# Patient Record
Sex: Male | Born: 1955 | Race: White | Hispanic: No | Smoking: Never smoker
Health system: Southern US, Community
[De-identification: ages and names within clinical notes are randomized; demographics above are authoritative.]

## PROBLEM LIST (undated history)

## (undated) DIAGNOSIS — Z87442 Personal history of urinary calculi: Secondary | ICD-10-CM

## (undated) DIAGNOSIS — N289 Disorder of kidney and ureter, unspecified: Secondary | ICD-10-CM

## (undated) DIAGNOSIS — I519 Heart disease, unspecified: Secondary | ICD-10-CM

## (undated) DIAGNOSIS — I517 Cardiomegaly: Secondary | ICD-10-CM

## (undated) DIAGNOSIS — I639 Cerebral infarction, unspecified: Secondary | ICD-10-CM

## (undated) DIAGNOSIS — T4145XA Adverse effect of unspecified anesthetic, initial encounter: Secondary | ICD-10-CM

## (undated) DIAGNOSIS — I10 Essential (primary) hypertension: Secondary | ICD-10-CM

## (undated) DIAGNOSIS — T8859XA Other complications of anesthesia, initial encounter: Secondary | ICD-10-CM

## (undated) HISTORY — PX: WISDOM TOOTH EXTRACTION: SHX21

## (undated) HISTORY — PX: FACIAL COSMETIC SURGERY: SHX629

## (undated) HISTORY — PX: MOUTH SURGERY: SHX715

## (undated) HISTORY — PX: TONSILLECTOMY: SUR1361

## (undated) HISTORY — PX: CHEST SURGERY: SHX595

---
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
## 2017-07-19 DIAGNOSIS — I517 Cardiomegaly: Secondary | ICD-10-CM

## 2017-07-19 DIAGNOSIS — I5189 Other ill-defined heart diseases: Secondary | ICD-10-CM

## 2017-07-19 HISTORY — DX: Other ill-defined heart diseases: I51.89

## 2017-07-19 HISTORY — DX: Cardiomegaly: I51.7

## 2017-08-01 ENCOUNTER — Emergency Department (HOSPITAL_COMMUNITY): Payer: Self-pay

## 2017-08-01 ENCOUNTER — Other Ambulatory Visit: Payer: Self-pay

## 2017-08-01 ENCOUNTER — Observation Stay (HOSPITAL_COMMUNITY)
Admission: EM | Admit: 2017-08-01 | Discharge: 2017-08-02 | Disposition: A | Discharge status: Home / Self Care | Payer: Self-pay | Attending: Internal Medicine | Admitting: Internal Medicine

## 2017-08-01 ENCOUNTER — Observation Stay (HOSPITAL_COMMUNITY): Payer: Self-pay

## 2017-08-01 ENCOUNTER — Encounter (HOSPITAL_COMMUNITY): Payer: Self-pay | Admitting: Emergency Medicine

## 2017-08-01 DIAGNOSIS — I639 Cerebral infarction, unspecified: Secondary | ICD-10-CM | POA: Diagnosis present

## 2017-08-01 DIAGNOSIS — R471 Dysarthria and anarthria: Secondary | ICD-10-CM | POA: Insufficient documentation

## 2017-08-01 DIAGNOSIS — I10 Essential (primary) hypertension: Secondary | ICD-10-CM | POA: Insufficient documentation

## 2017-08-01 DIAGNOSIS — Z79899 Other long term (current) drug therapy: Secondary | ICD-10-CM | POA: Insufficient documentation

## 2017-08-01 DIAGNOSIS — G9511 Acute infarction of spinal cord (embolic) (nonembolic): Principal | ICD-10-CM | POA: Insufficient documentation

## 2017-08-01 LAB — COMPREHENSIVE METABOLIC PANEL
ALK PHOS: 61 U/L (ref 38–126)
ALT: 19 U/L (ref 17–63)
ANION GAP: 9 (ref 5–15)
AST: 15 U/L (ref 15–41)
Albumin: 4.7 g/dL (ref 3.5–5.0)
BILIRUBIN TOTAL: 1.1 mg/dL (ref 0.3–1.2)
BUN: 15 mg/dL (ref 6–20)
CHLORIDE: 100 mmol/L — AB (ref 101–111)
CO2: 29 mmol/L (ref 22–32)
CREATININE: 1.04 mg/dL (ref 0.61–1.24)
Calcium: 9.3 mg/dL (ref 8.9–10.3)
GLUCOSE: 101 mg/dL — AB (ref 65–99)
Potassium: 3.9 mmol/L (ref 3.5–5.1)
SODIUM: 138 mmol/L (ref 135–145)
Total Protein: 8.3 g/dL — ABNORMAL HIGH (ref 6.5–8.1)

## 2017-08-01 LAB — CBC WITH DIFFERENTIAL/PLATELET
Basophils Absolute: 0 10*3/uL (ref 0.0–0.1)
Basophils Relative: 0 %
EOS ABS: 0.3 10*3/uL (ref 0.0–0.7)
Eosinophils Relative: 3 %
HEMATOCRIT: 46 % (ref 39.0–52.0)
HEMOGLOBIN: 16.3 g/dL (ref 13.0–17.0)
LYMPHS ABS: 2.2 10*3/uL (ref 0.7–4.0)
LYMPHS PCT: 29 %
MCH: 28 pg (ref 26.0–34.0)
MCHC: 35.4 g/dL (ref 30.0–36.0)
MCV: 78.9 fL (ref 78.0–100.0)
MONOS PCT: 6 %
Monocytes Absolute: 0.5 10*3/uL (ref 0.1–1.0)
NEUTROS PCT: 62 %
Neutro Abs: 4.8 10*3/uL (ref 1.7–7.7)
Platelets: 191 10*3/uL (ref 150–400)
RBC: 5.83 MIL/uL — AB (ref 4.22–5.81)
RDW: 12.9 % (ref 11.5–15.5)
WBC: 7.8 10*3/uL (ref 4.0–10.5)

## 2017-08-01 MED ORDER — ACETAMINOPHEN 160 MG/5ML PO SOLN: 650.0000 mg | ORAL | Status: DC | PRN

## 2017-08-01 MED ORDER — SENNOSIDES-DOCUSATE SODIUM 8.6-50 MG PO TABS: 1.0000 | ORAL_TABLET | Freq: Every evening | ORAL | Status: DC | PRN

## 2017-08-01 MED ORDER — HYDRALAZINE HCL 20 MG/ML IJ SOLN: 10.0000 mg | Freq: Four times a day (QID) | INTRAMUSCULAR | Status: DC | PRN

## 2017-08-01 MED ORDER — ACETAMINOPHEN 325 MG PO TABS: 650.0000 mg | ORAL_TABLET | ORAL | Status: DC | PRN

## 2017-08-01 MED ORDER — ACETAMINOPHEN 650 MG RE SUPP: 650.0000 mg | RECTAL | Status: DC | PRN

## 2017-08-01 MED ORDER — ASPIRIN 300 MG RE SUPP: 300.0000 mg | Freq: Every day | RECTAL | Status: DC

## 2017-08-01 MED ORDER — ASPIRIN 325 MG PO TABS
325.0000 mg | ORAL_TABLET | Freq: Every day | ORAL | Status: DC
  Administered 2017-08-01 – 2017-08-02 (×2): 325 mg via ORAL
  Filled 2017-08-01 (×2): qty 1

## 2017-08-01 MED ORDER — STROKE: EARLY STAGES OF RECOVERY BOOK
Freq: Once | Status: AC
  Administered 2017-08-01: 20:00:00

## 2017-08-01 MED ORDER — ENOXAPARIN SODIUM 40 MG/0.4ML ~~LOC~~ SOLN
40.0000 mg | SUBCUTANEOUS | Status: DC
  Administered 2017-08-01: 40 mg via SUBCUTANEOUS
  Filled 2017-08-01: qty 0.4

## 2017-08-01 MED ORDER — SODIUM CHLORIDE 0.9 % IV SOLN: INTRAVENOUS | Status: DC

## 2017-08-01 NOTE — ED Notes (Signed)
Meal tray ordered 

## 2017-08-01 NOTE — Progress Notes (Signed)
PROVIDER NOTIFIED OF BP 181/108.   Vickki Hearing, RN

## 2017-08-01 NOTE — Consult Note (Signed)
Daniel A. Merlene Laughter, MD     [email or web address]          Daniel Castro is an 62 y.o. male.   ASSESSMENT/PLAN: 1.  Acute/subacute onset of dysarthria and incoordination of the left upper extremity: This most likely represent small infarct involving the ipsilateral posterior fossa of the contralateral deep white matter.  Risk factors likely controlled hypertension that is not treated poorly and age.  The patient likely also has untreated obstructive sleep apnea syndrome which is a risk factor.  His work-up has been initiated which I agree with.  He is on aspirin 325 for now.  Speech and occupational therapies are recommended.  2.  Suspected obstructive sleep apnea syndrome: This should be worked up on discharge.       The patient is a 62 year old right-handed white male who presents with marked fatigue, dysarthria and impaired dexterity involving the left hand.  His symptoms have fluctuated over the last week.  He is admitted to the hospital for presumed diagnosis of a stroke.  He is not a candidate for thrombolysis or other acute intervention because of the delayed presentation.  Patient does not report having chest pain, shortness of breath, symptoms involving the other extremities, alteration of consciousness or headaches.  He tells me he has a history of hypertension which has been managed with diet although he has not seen a physician in a long time.  The review of systems otherwise negative.    GENERAL: Pleasant overweight male in no acute distress.  HEENT: He does have a large tongue and large neck with a crowded posterior space.  Neck is supple.  ABDOMEN: soft  EXTREMITIES: No edema   BACK: Normal  SKIN: Normal by inspection.    MENTAL STATUS: Alert and oriented. Speech -this is moderately dysarthric, language and cognition are generally intact. Judgment and insight normal.   CRANIAL NERVES: Pupils are equal, round and reactive to light and  accomodation; extra ocular movements are full, there is no significant nystagmus; visual fields are full; upper and lower facial muscles are normal in strength and symmetric, there is no flattening of the nasolabial folds; tongue is midline; uvula is midline; shoulder elevation is normal.  MOTOR: Normal tone, bulk and strength; no pronator drift.  No drift of the upper and lower extremities.  COORDINATION: There is subtle dysmetria and rapid hand alternating movements of the left hand.  Right hand is normal.  No tremors, myoclonus or parkinsonism.  REFLEXES: Deep tendon reflexes are symmetrical and normal.   SENSATION: Normal to light touch, temperature, and pain.  There is no extinction on double simultaneous stimulation.    NIH stroke scale 2, 1 total 3.   Blood pressure (!) 181/108, pulse 63, temperature 98.6 F (37 C), temperature source Oral, resp. rate 20, height '6\' 1"'$  (1.854 m), weight 257 lb 1.6 oz (116.6 kg), SpO2 99 %.  History reviewed. No pertinent past medical history.  History reviewed. No pertinent surgical history.  No family history on file.  Social History:  reports that he has never smoked. He has never used smokeless tobacco. He reports that he drank alcohol. He reports that he has current or past drug history.  Allergies:  Allergies  Allergen Reactions  . Penicillins Other (See Comments)    unknown    Medications: Prior to Admission medications   Medication Sig Start Date End Date Taking? Authorizing Provider  ibuprofen (ADVIL,MOTRIN) 200 MG tablet Take 400 mg by mouth every 6 (six) hours as  needed.   Yes [provider]    Scheduled Meds: .  stroke: mapping our early stages of recovery book   Does not apply Once  . aspirin  300 mg Rectal Daily   Or  . aspirin  325 mg Oral Daily  . enoxaparin (LOVENOX) injection  40 mg Subcutaneous Q24H   Continuous Infusions: . sodium chloride     PRN Meds:.acetaminophen **OR** acetaminophen (TYLENOL)  oral liquid 160 mg/5 mL **OR** acetaminophen, hydrALAZINE, senna-docusate     Results for orders placed or performed during the hospital encounter of 08/01/17 (from the past 48 hour(s))  CBC with Differential/Platelet     Status: Abnormal   Collection Time: 08/01/17 11:37 AM  Result Value Ref Range   WBC 7.8 4.0 - 10.5 K/uL   RBC 5.83 (H) 4.22 - 5.81 MIL/uL   Hemoglobin 16.3 13.0 - 17.0 g/dL   HCT 46.0 39.0 - 52.0 %   MCV 78.9 78.0 - 100.0 fL   MCH 28.0 26.0 - 34.0 pg   MCHC 35.4 30.0 - 36.0 g/dL   RDW 12.9 11.5 - 15.5 %   Platelets 191 150 - 400 K/uL   Neutrophils Relative % 62 %   Neutro Abs 4.8 1.7 - 7.7 K/uL   Lymphocytes Relative 29 %   Lymphs Abs 2.2 0.7 - 4.0 K/uL   Monocytes Relative 6 %   Monocytes Absolute 0.5 0.1 - 1.0 K/uL   Eosinophils Relative 3 %   Eosinophils Absolute 0.3 0.0 - 0.7 K/uL   Basophils Relative 0 %   Basophils Absolute 0.0 0.0 - 0.1 K/uL    Comment: Performed at [facility], [address], [place], [place] [address]  Comprehensive metabolic panel     Status: Abnormal   Collection Time: 08/01/17 11:37 AM  Result Value Ref Range   Sodium 138 135 - 145 mmol/L   Potassium 3.9 3.5 - 5.1 mmol/L   Chloride 100 (L) 101 - 111 mmol/L   CO2 29 22 - 32 mmol/L   Glucose, Bld 101 (H) 65 - 99 mg/dL   BUN 15 6 - 20 mg/dL   Creatinine, Ser 1.04 0.61 - 1.24 mg/dL   Calcium 9.3 8.9 - 10.3 mg/dL   Total Protein 8.3 (H) 6.5 - 8.1 g/dL   Albumin 4.7 3.5 - 5.0 g/dL   AST 15 15 - 41 U/L   ALT 19 17 - 63 U/L   Alkaline Phosphatase 61 38 - 126 U/L   Total Bilirubin 1.1 0.3 - 1.2 mg/dL   GFR calc non Af Amer >60 >60 mL/min   GFR calc Af Amer >60 >60 mL/min    Comment: (NOTE) The eGFR has been calculated using the CKD EPI equation. This calculation has not been validated in all clinical situations. eGFR's persistently <60 mL/min signify possible Chronic Kidney Disease.    Anion gap 9 5 - 15    Comment: Performed at [facility], [address]  [place], [place] [address]    Studies/Results:   HEAD CT FINDINGS: Brain: Mild chronic ischemic white matter disease is noted. No mass effect or midline shift is noted. Ventricular size is within normal limits. There is no evidence of mass lesion, hemorrhage or acute infarction.  Vascular: No hyperdense vessel or unexpected calcification.  Skull: Normal. Negative for fracture or focal lesion.  Sinuses/Orbits: No acute finding.  Other: None.  IMPRESSION: Mild chronic ischemic white matter disease. No acute intracranial abnormality seen.      Jorge Retz A. Merlene Castro, M.D.  Diplomate, Tax adviser of Psychiatry and Neurology ( Neurology). 08/01/2017, 6:51 PM

## 2017-08-01 NOTE — ED Provider Notes (Signed)
[facility] EMERGENCY DEPARTMENT Provider Note   CSN: [id number] Arrival date & time: 08/01/17  4098     History   Chief Complaint Chief Complaint  Patient presents with  . Weakness    HPI Daniel Castro is a 62 y.o. male.  Patient states that 2 days ago he noticed that he started having slurred speech and minimal weakness in his left hand he states that he kept dropping his cell phone  The history is provided by the patient. No language interpreter was used.  Weakness  Primary symptoms include focal weakness. This is a new problem. The current episode started 2 days ago. The problem has not changed since onset.Affected Side: Left hand and slurred speech. There has been no fever. Pertinent negatives include no shortness of breath, no chest pain and no headaches.    History reviewed. No pertinent past medical history.  Patient Active Problem List   Diagnosis Date Noted  . Stroke [facility]) 08/01/2017    History reviewed. No pertinent surgical history.      Home Medications    Prior to Admission medications   Medication Sig Start Date End Date Taking? Authorizing Provider  ibuprofen (ADVIL,MOTRIN) 200 MG tablet Take 400 mg by mouth every 6 (six) hours as needed.   Yes [provider]    Family History No family history on file.  Social History Social History   Tobacco Use  . Smoking status: Never Smoker  . Smokeless tobacco: Never Used  Substance Use Topics  . Alcohol use: Not Currently  . Drug use: Not Currently     Allergies   Penicillins   Review of Systems Review of Systems  Constitutional: Negative for appetite change and fatigue.  HENT: Negative for congestion, ear discharge and sinus pressure.        Slurred speech  Eyes: Negative for discharge.  Respiratory: Negative for cough and shortness of breath.   Cardiovascular: Negative for chest pain.  Gastrointestinal: Negative for abdominal pain and diarrhea.  Genitourinary: Negative for  frequency and hematuria.  Musculoskeletal: Negative for back pain.  Skin: Negative for rash.  Neurological: Positive for focal weakness and weakness. Negative for seizures and headaches.  Psychiatric/Behavioral: Negative for hallucinations.     Physical Exam Updated Vital Signs BP (!) 198/101 (BP Location: Left Arm)   Pulse (!) 55   Temp 97.7 F (36.5 C) (Oral)   Resp 20   Ht  (1.854 m)   Wt 111.1 kg (245 lb)   SpO2 96%   BMI 32.32 kg/m   Physical Exam  Constitutional: He is oriented to person, place, and time. He appears well-developed.  HENT:  Head: Normocephalic.  Patient with minimal slurred speech  Eyes: Conjunctivae and EOM are normal. No scleral icterus.  Neck: Neck supple. No thyromegaly present.  Cardiovascular: Normal rate and regular rhythm. Exam reveals no gallop and no friction rub.  No murmur heard. Pulmonary/Chest: No stridor. He has no wheezes. He has no rales. He exhibits no tenderness.  Abdominal: He exhibits no distension. There is no tenderness. There is no rebound.  Musculoskeletal: Normal range of motion. He exhibits no edema.  No obvious weakness to left arm but patient states that he keeps dropping his cell phone  Lymphadenopathy:    He has no cervical adenopathy.  Neurological: He is oriented to person, place, and time. He exhibits normal muscle tone. Coordination normal.  Skin: No rash noted. No erythema.  Psychiatric: He has a normal mood and affect. His behavior  is normal.     ED Treatments / Results  Labs (all labs ordered are listed, but only abnormal results are displayed) Labs Reviewed  CBC WITH DIFFERENTIAL/PLATELET - Abnormal; Notable for the following components:      Result Value   RBC 5.83 (*)    All other components within normal limits  COMPREHENSIVE METABOLIC PANEL - Abnormal; Notable for the following components:   Chloride 100 (*)    Glucose, Bld 101 (*)    Total Protein 8.3 (*)    All other components within normal  limits    EKG EKG Interpretation  Date/Time:  Tuesday Aug 01 2017 11:42:10 EDT Ventricular Rate:  54 PR Interval:    QRS Duration: 97 QT Interval:  450 QTC Calculation: 427 R Axis:   -14 Text Interpretation:  Sinus rhythm Low voltage, precordial leads LVH with secondary repolarization abnormality Anterior Q waves, possibly due to LVH Borderline ST elevation, inferior leads Confirmed by Bethann Berkshire [phone]) on 08/01/2017 2:25:33 PM   Radiology Ct Head Wo Contrast  Result Date: 08/01/2017 CLINICAL DATA:  Slurred speech, left upper extremity weakness. EXAM: CT HEAD WITHOUT CONTRAST TECHNIQUE: Contiguous axial images were obtained from the base of the skull through the vertex without intravenous contrast. COMPARISON:  None. FINDINGS: Brain: Mild chronic ischemic white matter disease is noted. No mass effect or midline shift is noted. Ventricular size is within normal limits. There is no evidence of mass lesion, hemorrhage or acute infarction. Vascular: No hyperdense vessel or unexpected calcification. Skull: Normal. Negative for fracture or focal lesion. Sinuses/Orbits: No acute finding. Other: None. IMPRESSION: Mild chronic ischemic white matter disease. No acute intracranial abnormality seen. Electronically Signed   By: Lupita Raider, M.D.   On: 08/01/2017 12:39    Procedures Procedures (including critical care time)  Medications Ordered in ED Medications - No data to display   Initial Impression / Assessment and Plan / ED Course  I have reviewed the triage vital signs and the nursing notes.  Pertinent labs & imaging results that were available during my care of the patient were reviewed by me and considered in my medical decision making (see chart for details).     Patient blood pressure is moderately elevated.  Labs unremarkable CT scan shows no acute stroke.  I suspect he did have a stroke just has not shown up yet.  He will be admitted to medicine to get MRI continue the  work-up  Final Clinical Impressions(s) / ED Diagnoses   Final diagnoses:  Acute embolic stroke [facility])    ED Discharge Orders    None       Bethann Berkshire, MD 08/01/17 1453

## 2017-08-01 NOTE — ED Notes (Signed)
EDP, [facility] notified of pt condition

## 2017-08-01 NOTE — ED Triage Notes (Signed)
Pt c/o of "sounding drunk talking" and left arm/hand weakness since night of 07/30/2017, no droop or grip difference noted at triage but small speech slur noticed

## 2017-08-01 NOTE — H&P (Signed)
History and Physical    Daniel Castro ZOX:[id number] DOB: April 15, 1955 DOA: 08/01/2017  PCP: Patient, No Pcp Per  Patient coming from: Home  I have personally briefly reviewed patient's old medical records in [facility]  Chief Complaint: Dysarthria  HPI: Daniel Castro is a 62 y.o. male with medical history significant of hypertension, was previously on medication, but has been off for years now.  Does not seek regular medical care.  Patient reports approximately 2 days ago he noticed some difficulty with his speech.  He was noticing difficulty chewing his food.  Felt as though the inside of his cheek may be numb.  Felt as if his tongue was "thick".  Did not have any difficulty ambulating.  He also noticed that he was dropping things out of his left hand.  Denies any changes in vision.  No chest pain or shortness of breath.  No fever, cough, nausea, vomiting, diarrhea, dysuria.  No new medications.  ED Course: He was evaluated in the emergency room and noted to be hypertensive.  CT scan of the head was unremarkable.  Basic labs were unrevealing.  He is been referred for admission for further stroke work-up.  Review of Systems: As per HPI otherwise 10 point review of systems negative.    History reviewed. No pertinent past medical history.  History reviewed. No pertinent surgical history.   reports that he has never smoked. He has never used smokeless tobacco. He reports that he drank alcohol. He reports that he has current or past drug history.  Allergies  Allergen Reactions  . Penicillins Other (See Comments)    unknown    Family history: Father had Lorelee Cover, sister has had a stroke  Prior to Admission medications   Medication Sig Start Date End Date Taking? Authorizing Provider  ibuprofen (ADVIL,MOTRIN) 200 MG tablet Take 400 mg by mouth every 6 (six) hours as needed.   Yes [provider]    Physical Exam: Vitals:   08/01/17 1600 08/01/17 1641  08/01/17 1643 08/01/17 1845  BP: (!) 185/95 (!) 181/108  (!) 179/101  Pulse:  63  62  Resp: 20   18  Temp:  98.6 F (37 C)  98.5 F (36.9 C)  TempSrc:  Oral  Oral  SpO2:  99%  98%  Weight:   116.6 kg (257 lb 1.6 oz)   Height:    (1.854 m)     Constitutional: NAD, calm, comfortable Vitals:   08/01/17 1600 08/01/17 1641 08/01/17 1643 08/01/17 1845  BP: (!) 185/95 (!) 181/108  (!) 179/101  Pulse:  63  62  Resp: 20   18  Temp:  98.6 F (37 C)  98.5 F (36.9 C)  TempSrc:  Oral  Oral  SpO2:  99%  98%  Weight:   116.6 kg (257 lb 1.6 oz)   Height:    (1.854 m)    Eyes: PERRL, lids and conjunctivae normal ENMT: Mucous membranes are moist. Posterior pharynx clear of any exudate or lesions.Normal dentition.  Neck: normal, supple, no masses, no thyromegaly Respiratory: clear to auscultation bilaterally, no wheezing, no crackles. Normal respiratory effort. No accessory muscle use.  Cardiovascular: Regular rate and rhythm, no murmurs / rubs / gallops. No extremity edema. 2+ pedal pulses. No carotid bruits.  Abdomen: no tenderness, no masses palpated. No hepatosplenomegaly. Bowel sounds positive.  Musculoskeletal: no clubbing / cyanosis. No joint deformity upper and lower extremities. Good ROM, no contractures. Normal muscle tone.  Skin: no rashes,  lesions, ulcers. No induration Neurologic: CN 2-12 grossly intact. Sensation intact, DTR normal. Strength 5/5 in all 4.  Psychiatric: Normal judgment and insight. Alert and oriented x 3. Normal mood.    Labs on Admission: I have personally reviewed following labs and imaging studies  CBC: Recent Labs  Lab 08/01/17 1137  WBC 7.8  NEUTROABS 4.8  HGB 16.3  HCT 46.0  MCV 78.9  PLT 191   Basic Metabolic Panel: Recent Labs  Lab 08/01/17 1137  NA 138  K 3.9  CL 100*  CO2 29  GLUCOSE 101*  BUN 15  CREATININE 1.04  CALCIUM 9.3   GFR: Estimated Creatinine Clearance: 99.8 mL/min (by C-G formula based on SCr of 1.04  mg/dL). Liver Function Tests: Recent Labs  Lab 08/01/17 1137  AST 15  ALT 19  ALKPHOS 61  BILITOT 1.1  PROT 8.3*  ALBUMIN 4.7   No results for input(s): LIPASE, AMYLASE in the last 168 hours. No results for input(s): AMMONIA in the last 168 hours. Coagulation Profile: No results for input(s): INR, PROTIME in the last 168 hours. Cardiac Enzymes: No results for input(s): CKTOTAL, CKMB, CKMBINDEX, TROPONINI in the last 168 hours. BNP (last 3 results) No results for input(s): PROBNP in the last 8760 hours. HbA1C: No results for input(s): HGBA1C in the last 72 hours. CBG: No results for input(s): GLUCAP in the last 168 hours. Lipid Profile: No results for input(s): CHOL, HDL, LDLCALC, TRIG, CHOLHDL, LDLDIRECT in the last 72 hours. Thyroid Function Tests: No results for input(s): TSH, T4TOTAL, FREET4, T3FREE, THYROIDAB in the last 72 hours. Anemia Panel: No results for input(s): VITAMINB12, FOLATE, FERRITIN, TIBC, IRON, RETICCTPCT in the last 72 hours. Urine analysis: No results found for: COLORURINE, APPEARANCEUR, LABSPEC, PHURINE, GLUCOSEU, HGBUR, BILIRUBINUR, KETONESUR, PROTEINUR, UROBILINOGEN, NITRITE, LEUKOCYTESUR  Radiological Exams on Admission: Ct Head Wo Contrast  Result Date: 08/01/2017 CLINICAL DATA:  Slurred speech, left upper extremity weakness. EXAM: CT HEAD WITHOUT CONTRAST TECHNIQUE: Contiguous axial images were obtained from the base of the skull through the vertex without intravenous contrast. COMPARISON:  None. FINDINGS: Brain: Mild chronic ischemic white matter disease is noted. No mass effect or midline shift is noted. Ventricular size is within normal limits. There is no evidence of mass lesion, hemorrhage or acute infarction. Vascular: No hyperdense vessel or unexpected calcification. Skull: Normal. Negative for fracture or focal lesion. Sinuses/Orbits: No acute finding. Other: None. IMPRESSION: Mild chronic ischemic white matter disease. No acute intracranial  abnormality seen. Electronically Signed   By: Lupita Raider, M.D.   On: 08/01/2017 12:39    EKG: Independently reviewed.  Sinus rhythm without acute changes.  Assessment/Plan Active Problems:   Stroke (HCC)   HTN (hypertension)     1. Dysarthria, concern for underlying stroke.  Patient has dysarthria and left hand weakness.  He will undergo further stroke work-up including MRI/MRA head, echocardiogram, carotid Dopplers.  Start on aspirin.  Neurology consultation.  Check A1c and repeat panel 2. Hypertension.  Will allow for permissive hypertension.  DVT prophylaxis: Lovenox Code Status: Full code Family Communication: No family present Disposition Plan: Discharge home once improved Consults called: Neurology Admission status: Observation, telemetry  Erick Blinks MD [facility] Pager [phone]  If 7PM-7AM, please contact night-coverage [email or web address] Password [email or web address]  08/01/2017, 7:30 PM

## 2017-08-02 ENCOUNTER — Observation Stay (HOSPITAL_COMMUNITY): Payer: Self-pay

## 2017-08-02 ENCOUNTER — Observation Stay (HOSPITAL_BASED_OUTPATIENT_CLINIC_OR_DEPARTMENT_OTHER): Payer: Self-pay

## 2017-08-02 DIAGNOSIS — I5030 Unspecified diastolic (congestive) heart failure: Secondary | ICD-10-CM

## 2017-08-02 DIAGNOSIS — I639 Cerebral infarction, unspecified: Secondary | ICD-10-CM

## 2017-08-02 DIAGNOSIS — I10 Essential (primary) hypertension: Secondary | ICD-10-CM

## 2017-08-02 LAB — LIPID PANEL
CHOL/HDL RATIO: 5.8 ratio
CHOLESTEROL: 197 mg/dL (ref 0–200)
HDL: 34 mg/dL — AB (ref >40)
LDL Cholesterol: 89 mg/dL (ref 0–99)
TRIGLYCERIDES: 369 mg/dL — AB (ref <150)
VLDL: 74 mg/dL — AB (ref 0–40)

## 2017-08-02 LAB — ECHOCARDIOGRAM COMPLETE
Height: 73 in
Weight: 4113.6 oz

## 2017-08-02 LAB — HIV ANTIBODY (ROUTINE TESTING W REFLEX): HIV SCREEN 4TH GENERATION: NONREACTIVE

## 2017-08-02 LAB — HEMOGLOBIN A1C
Hgb A1c MFr Bld: 5.3 % (ref 4.8–5.6)
Mean Plasma Glucose: 105.41 mg/dL

## 2017-08-02 MED ORDER — LISINOPRIL 10 MG PO TABS
10.0000 mg | ORAL_TABLET | Freq: Every day | ORAL | Status: DC
  Administered 2017-08-02: 10 mg via ORAL
  Filled 2017-08-02: qty 1

## 2017-08-02 MED ORDER — SIMVASTATIN 10 MG PO TABS
10.0000 mg | ORAL_TABLET | Freq: Every day | ORAL | Status: DC
  Administered 2017-08-02: 10 mg via ORAL
  Filled 2017-08-02: qty 1

## 2017-08-02 MED ORDER — LOVASTATIN 10 MG PO TABS: 10.0000 mg | ORAL_TABLET | Freq: Every day | ORAL | 1 refills | Status: AC

## 2017-08-02 MED ORDER — ASPIRIN EC 81 MG PO TBEC: 81.0000 mg | DELAYED_RELEASE_TABLET | Freq: Every day | ORAL | 2 refills | Status: AC

## 2017-08-02 MED ORDER — LISINOPRIL 10 MG PO TABS: 10.0000 mg | ORAL_TABLET | Freq: Every day | ORAL | 1 refills | Status: AC

## 2017-08-02 MED ORDER — ENOXAPARIN SODIUM 60 MG/0.6ML ~~LOC~~ SOLN: 50.0000 mg | SUBCUTANEOUS | Status: DC

## 2017-08-02 NOTE — Progress Notes (Signed)
*  PRELIMINARY RESULTS* Echocardiogram 2D Echocardiogram has been performed.  Stacey Drain 08/02/2017, 4:22 PM

## 2017-08-02 NOTE — Discharge Summary (Signed)
Physician Discharge Summary  Daniel Castro ZOX:[id number] DOB: 1955/05/02 DOA: 08/01/2017  PCP: Patient, No Pcp Per  Admit date: 08/01/2017 Discharge date: 08/02/2017  Admitted From: Home Disposition: Home  Recommendations for Outpatient Follow-up:  1. Follow up with PCP in 1-2 weeks 2. Please obtain BMP/CBC in one week  Home Health: Equipment/Devices:  Discharge Condition: Stable CODE STATUS: Full code Diet recommendation: Heart Healthy  Brief/Interim Summary: 62 year old male with a history of hypertension, presented to the emergency room with dysarthria and left grip weakness.  The patient was noted to be significantly hypertensive on arrival.  He was admitted for further stroke work-up.  He did not receive TPA since his symptoms were present for over 2 days prior to arrival.  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  Carotid Dopplers and echocardiogram were unrevealing.  Lipid panel indicated LDL was above goal and he was started on a statin.  Patient was started on antiplatelet therapy with aspirin.  Since blood pressure was elevated, he was started on lisinopril for blood pressure control.  Patient was feeling significantly better by the following day felt ready for discharge home.  He was seen by speech therapy and no further speech therapy was recommended.  Discharge Diagnoses:  Active Problems:   Stroke [facility])   HTN (hypertension)    Discharge Instructions  Discharge Instructions    Diet - low sodium heart healthy   Complete by:  As directed    Increase activity slowly   Complete by:  As directed      Allergies as of 08/02/2017      Reactions   Penicillins Other (See Comments)   unknown      Medication List    TAKE these medications   aspirin EC 81 MG tablet Take 1 tablet (81 mg total) by mouth daily.   ibuprofen 200 MG tablet Commonly known as:  ADVIL,MOTRIN Take 400 mg by mouth every 6 (six) hours as needed.   lisinopril 10 MG  tablet Commonly known as:  PRINIVIL,ZESTRIL Take 1 tablet (10 mg total) by mouth daily.   lovastatin 10 MG tablet Commonly known as:  MEVACOR Take 1 tablet (10 mg total) by mouth at bedtime.       Allergies  Allergen Reactions  . Penicillins Other (See Comments)    unknown    Consultations:  Neurology   Procedures/Studies: Ct Head Wo Contrast  Result Date: 08/01/2017 CLINICAL DATA:  Slurred speech, left upper extremity weakness. EXAM: CT HEAD WITHOUT CONTRAST TECHNIQUE: Contiguous axial images were obtained from the base of the skull through the vertex without intravenous contrast. COMPARISON:  None. FINDINGS: Brain: Mild chronic ischemic white matter disease is noted. No mass effect or midline shift is noted. Ventricular size is within normal limits. There is no evidence of mass lesion, hemorrhage or acute infarction. Vascular: No hyperdense vessel or unexpected calcification. Skull: Normal. Negative for fracture or focal lesion. Sinuses/Orbits: No acute finding. Other: None. IMPRESSION: Mild chronic ischemic white matter disease. No acute intracranial abnormality seen. Electronically Signed   By: Lupita Raider, M.D.   On: 08/01/2017 12:39   Mr Brain Wo Contrast  Result Date: 08/01/2017 CLINICAL DATA:  Initial evaluation for slurred speech, left-sided weakness. EXAM: MRI HEAD WITHOUT CONTRAST MRA HEAD WITHOUT CONTRAST TECHNIQUE: Multiplanar, multiecho pulse sequences of the brain and surrounding structures were obtained without intravenous contrast. Angiographic images of the head were obtained using MRA technique without contrast. COMPARISON:  Prior CT from earlier same day. FINDINGS: MRI  HEAD FINDINGS Brain: Study limited as the patient was unable to tolerate the full length of the exam. Diffusion-weighted imaging and sagittal T1 weighted sequence only was performed. Diffusion-weighted sequence demonstrates a 9 mm focus of restricted diffusion at the right cerebral peduncle along  the right cortical spinal tract (series 3, image 80), consistent with an acute ischemic infarct. No findings to suggest associated hemorrhage on this limited exam. No significant mass effect. No other evidence for acute or subacute ischemia. Gray-white matter differentiation otherwise grossly maintained. No other obvious areas of chronic infarction. No discernible mass lesion on this limited exam. No mass effect or midline shift. No hydrocephalus. No extra-axial fluid collection. Vascular: Poorly evaluated on this limited exam. Skull and upper cervical spine: Craniocervical junction within normal limits. Upper cervical spine normal. Bone marrow signal intensity grossly within normal limits. No scalp soft tissue abnormality. Sinuses/Orbits: Not well evaluated on this limited study. Other: Negative. MRA HEAD FINDINGS ANTERIOR CIRCULATION: Study moderate to severely degraded by motion artifact. Distal cervical segments of the internal carotid arteries are patent with antegrade flow. Petrous segments patent bilaterally. Scattered atheromatous irregularity throughout the cavernous/supraclinoid ICAs without obvious flow-limiting stenosis. ICA termini patent. A1 segments and anterior cerebral arteries patent without obvious high-grade stenosis. M1 segments patent bilaterally. No proximal M2 occlusion. Distal MCA branches well perfused and fairly symmetric. POSTERIOR CIRCULATION: Vertebral arteries patent to the vertebrobasilar junction without stenosis. Posterior inferior cerebral arteries patent proximally. Basilar patent to its distal aspect without stenosis. Superior cerebellar arteries patent bilaterally. Right PCA supplied via the basilar. Predominant fetal type origin of the left PCA supplied via a left posterior communicating artery, although a hypoplastic left P1 segment likely present. PCAs grossly patent proximally, not well evaluated distally due to motion. IMPRESSION: MRI HEAD IMPRESSION 1. Limited study due to  the patient's inability to tolerate the full length of the exam. 2. 9 mm acute ischemic infarct at the right cerebral peduncle. 3. No other obvious acute intracranial abnormality. MRA HEAD IMPRESSION 1. Limited study due to motion artifact. 2. Negative intracranial MRA for large vessel occlusion. No obvious high-grade proximal or correctable stenosis identified. Electronically Signed   By: Rise Mu M.D.   On: 08/01/2017 19:43   US Carotid Bilateral (at [facility] And [facility] Only)  Result Date: 08/02/2017 CLINICAL DATA:  Stroke. Slurred speech. Left-sided weakness. History of hypertension. EXAM: BILATERAL CAROTID DUPLEX ULTRASOUND TECHNIQUE: Wallace Cullens scale imaging, color Doppler and duplex ultrasound were performed of bilateral carotid and vertebral arteries in the neck. COMPARISON:  None. FINDINGS: Criteria: Quantification of carotid stenosis is based on velocity parameters that correlate the residual internal carotid diameter with NASCET-based stenosis levels, using the diameter of the distal internal carotid lumen as the denominator for stenosis measurement. The following velocity measurements were obtained: RIGHT ICA:  72/14 cm/sec CCA:  100/16 cm/sec SYSTOLIC ICA/CCA RATIO:  0.8 ECA:  135 cm/sec LEFT ICA:  63/20 cm/sec CCA:  97/17 cm/sec SYSTOLIC ICA/CCA RATIO:  0.7 ECA:  76 cm/sec RIGHT CAROTID ARTERY: There is a minimal amount of intimal thickening and atherosclerotic plaque within the right carotid bulb (image 18 and 19). There is a moderate amount of echogenic plaque involving the origin and proximal aspects of the right internal carotid artery (image 29), not resulting in elevated peak systolic velocities within the interrogated course the right internal carotid artery to suggest a hemodynamically significant stenosis RIGHT VERTEBRAL ARTERY:  Antegrade flow LEFT CAROTID ARTERY: There is a minimal amount mixed echogenic plaque within the left carotid bulb (  image 60), not resulting in elevated peak systolic  velocities within the interrogated course the left internal carotid artery to suggest a hemodynamically significant stenosis. LEFT VERTEBRAL ARTERY:  Antegrade flow IMPRESSION: Minimal to moderate amount of bilateral atherosclerotic plaque, right subjectively greater than left, not resulting in a hemodynamically significant stenosis within either internal carotid artery. Electronically Signed   By: Simonne Come M.D.   On: 08/02/2017 15:18   Mr Maxine Glenn Head/brain ZO Cm  Result Date: 08/01/2017 CLINICAL DATA:  Initial evaluation for slurred speech, left-sided weakness. EXAM: MRI HEAD WITHOUT CONTRAST MRA HEAD WITHOUT CONTRAST TECHNIQUE: Multiplanar, multiecho pulse sequences of the brain and surrounding structures were obtained without intravenous contrast. Angiographic images of the head were obtained using MRA technique without contrast. COMPARISON:  Prior CT from earlier same day. FINDINGS: MRI HEAD FINDINGS Brain: Study limited as the patient was unable to tolerate the full length of the exam. Diffusion-weighted imaging and sagittal T1 weighted sequence only was performed. Diffusion-weighted sequence demonstrates a 9 mm focus of restricted diffusion at the right cerebral peduncle along the right cortical spinal tract (series 3, image 80), consistent with an acute ischemic infarct. No findings to suggest associated hemorrhage on this limited exam. No significant mass effect. No other evidence for acute or subacute ischemia. Gray-white matter differentiation otherwise grossly maintained. No other obvious areas of chronic infarction. No discernible mass lesion on this limited exam. No mass effect or midline shift. No hydrocephalus. No extra-axial fluid collection. Vascular: Poorly evaluated on this limited exam. Skull and upper cervical spine: Craniocervical junction within normal limits. Upper cervical spine normal. Bone marrow signal intensity grossly within normal limits. No scalp soft tissue abnormality.  Sinuses/Orbits: Not well evaluated on this limited study. Other: Negative. MRA HEAD FINDINGS ANTERIOR CIRCULATION: Study moderate to severely degraded by motion artifact. Distal cervical segments of the internal carotid arteries are patent with antegrade flow. Petrous segments patent bilaterally. Scattered atheromatous irregularity throughout the cavernous/supraclinoid ICAs without obvious flow-limiting stenosis. ICA termini patent. A1 segments and anterior cerebral arteries patent without obvious high-grade stenosis. M1 segments patent bilaterally. No proximal M2 occlusion. Distal MCA branches well perfused and fairly symmetric. POSTERIOR CIRCULATION: Vertebral arteries patent to the vertebrobasilar junction without stenosis. Posterior inferior cerebral arteries patent proximally. Basilar patent to its distal aspect without stenosis. Superior cerebellar arteries patent bilaterally. Right PCA supplied via the basilar. Predominant fetal type origin of the left PCA supplied via a left posterior communicating artery, although a hypoplastic left P1 segment likely present. PCAs grossly patent proximally, not well evaluated distally due to motion. IMPRESSION: MRI HEAD IMPRESSION 1. Limited study due to the patient's inability to tolerate the full length of the exam. 2. 9 mm acute ischemic infarct at the right cerebral peduncle. 3. No other obvious acute intracranial abnormality. MRA HEAD IMPRESSION 1. Limited study due to motion artifact. 2. Negative intracranial MRA for large vessel occlusion. No obvious high-grade proximal or correctable stenosis identified. Electronically Signed   By: Rise Mu M.D.   On: 08/01/2017 19:43    Echo: - Left ventricle: The cavity size was normal. Wall thickness was   increased in a pattern of moderate LVH. Systolic function was   normal. The estimated ejection fraction was in the range of 55%   to 60%. Wall motion was normal; there were no regional wall   motion  abnormalities. Doppler parameters are consistent with   abnormal left ventricular relaxation (grade 1 diastolic   dysfunction). - Aortic valve: Mildly to moderately calcified annulus.  Trileaflet. - Mitral valve: There was trivial regurgitation. - Right atrium: Central venous pressure (est): 3 mm Hg. - Atrial septum: No defect or patent foramen ovale was identified. - Tricuspid valve: There was physiologic regurgitation. - Pulmonary arteries: Systolic pressure could not be accurately   estimated. - Pericardium, extracardiac: A prominent pericardial fat pad was   present.   Subjective: Continues to feel that he is having difficulty speaking.  Overall left hand weakness is better.  Discharge Exam: Vitals:   08/02/17 1417 08/02/17 1645  BP: (!) 180/105 (!) 172/105  Pulse: (!) 57 (!) 59  Resp: 19 20  Temp: 98.2 F (36.8 C) 98.5 F (36.9 C)  SpO2: 97% 99%   Vitals:   08/02/17 0850 08/02/17 1245 08/02/17 1417 08/02/17 1645  BP: (!) 168/94 (!) 195/105 (!) 180/105 (!) 172/105  Pulse: (!) 56 61 (!) 57 (!) 59  Resp: Temp: 97.7 F (36.5 C) 98.1 F (36.7 C) 98.2 F (36.8 C) 98.5 F (36.9 C)  TempSrc: Oral Oral Oral Oral  SpO2: 98% 100% 97% 99%  Weight:      Height:        General: Pt is alert, awake, not in acute distress Cardiovascular: RRR, S1/S2 +, no rubs, no gallops Respiratory: CTA bilaterally, no wheezing, no rhonchi Abdominal: Soft, NT, ND, bowel sounds + Extremities: no edema, no cyanosis    The results of significant diagnostics from this hospitalization (including imaging, microbiology, ancillary and laboratory) are listed below for reference.     Microbiology: No results found for this or any previous visit (from the past 240 hour(s)).   Labs: BNP (last 3 results) No results for input(s): BNP in the last 8760 hours. Basic Metabolic Panel: Recent Labs  Lab 08/01/17 1137  NA 138  K 3.9  CL 100*  CO2 29  GLUCOSE 101*  BUN 15  CREATININE  1.04  CALCIUM 9.3   Liver Function Tests: Recent Labs  Lab 08/01/17 1137  AST 15  ALT 19  ALKPHOS 61  BILITOT 1.1  PROT 8.3*  ALBUMIN 4.7   No results for input(s): LIPASE, AMYLASE in the last 168 hours. No results for input(s): AMMONIA in the last 168 hours. CBC: Recent Labs  Lab 08/01/17 1137  WBC 7.8  NEUTROABS 4.8  HGB 16.3  HCT 46.0  MCV 78.9  PLT 191   Cardiac Enzymes: No results for input(s): CKTOTAL, CKMB, CKMBINDEX, TROPONINI in the last 168 hours. BNP: Invalid input(s): POCBNP CBG: No results for input(s): GLUCAP in the last 168 hours. D-Dimer No results for input(s): DDIMER in the last 72 hours. Hgb A1c Recent Labs    08/02/17 0506  HGBA1C 5.3   Lipid Profile Recent Labs    08/02/17 0506  CHOL 197  HDL 34*  LDLCALC 89  TRIG 161*  CHOLHDL 5.8   Thyroid function studies No results for input(s): TSH, T4TOTAL, T3FREE, THYROIDAB in the last 72 hours.  Invalid input(s): FREET3 Anemia work up No results for input(s): VITAMINB12, FOLATE, FERRITIN, TIBC, IRON, RETICCTPCT in the last 72 hours. Urinalysis No results found for: COLORURINE, APPEARANCEUR, LABSPEC, PHURINE, GLUCOSEU, HGBUR, BILIRUBINUR, KETONESUR, PROTEINUR, UROBILINOGEN, NITRITE, LEUKOCYTESUR Sepsis Labs Invalid input(s): PROCALCITONIN,  WBC,  LACTICIDVEN Microbiology No results found for this or any previous visit (from the past 240 hour(s)).   Time coordinating discharge:  SIGNED:   Erick Blinks, MD  [facility] 08/02/2017, 7:51 PM Pager   If 7PM-7AM, please contact night-coverage [email or web address] Password [email or web address]

## 2017-08-02 NOTE — Progress Notes (Signed)
Patient was discharged at 1920 on 08/02/2017. Patient IV and telly was removed. Answered any questions patient had before leaving.

## 2017-08-02 NOTE — Evaluation (Signed)
Physical Therapy Evaluation Patient Details Name: Daniel Castro MRN: [id number] DOB: 03/18/1956 Today's Date: 08/02/2017   History of Present Illness  Daniel Castro is a 62 y.o. male with medical history significant of hypertension, was previously on medication, but has been off for years now.  Does not seek regular medical care.  Patient reports approximately 2 days ago he noticed some difficulty with his speech.  He was noticing difficulty chewing his food.  Felt as though the inside of his cheek may be numb.  Felt as if his tongue was "thick".  Did not have any difficulty ambulating.  He also noticed that he was dropping things out of his left hand.  Denies any changes in vision.  No chest pain or shortness of breath.  No fever, cough, nausea, vomiting, diarrhea, dysuria.  No new medications.    Clinical Impression  Patient functioning at baseline for functional mobility and gait.  Plan:   Patient discharged from physical therapy to care of nursing for ambulation daily as tolerated for length of stay.    Follow Up Recommendations No PT follow up    Equipment Recommendations  None recommended by PT    Recommendations for Other Services       Precautions / Restrictions Precautions Precautions: None Restrictions Weight Bearing Restrictions: No      Mobility  Bed Mobility Overal bed mobility: Independent                Transfers Overall transfer level: Independent                  Ambulation/Gait Ambulation/Gait assistance: Independent Ambulation Distance (Feet): 150 Feet Assistive device: None Gait Pattern/deviations: WFL(Within Functional Limits)     General Gait Details: no problem going up town ramp, no loss of balance  Stairs Stairs: Yes Stairs assistance: Modified independent (Device/Increase time) Stair Management: One rail Right;Alternating pattern Number of Stairs: 9 General stair comments: demonsrates good return going up stairs without  use of siderails with alternating pattern, uses 1 siderail for coming down stairs without loss of balance  Wheelchair Mobility    Modified Rankin (Stroke Patients Only)       Balance Overall balance assessment: No apparent balance deficits (not formally assessed)                                           Pertinent Vitals/Pain Pain Assessment: No/denies pain    Home Living Family/patient expects to be discharged to:: Private residence Living Arrangements: Alone Available Help at Discharge: Family Type of Home: House Home Access: Stairs to enter Entrance Stairs-Rails: None Entrance Stairs-Number of Steps: 1 Home Layout: Two level Home Equipment: Wheelchair - [employer] - 2 wheels;Cane - single point      Prior Function Level of Independence: Independent         Comments: community ambulator, drives, work involves climbing onto roofs     Higher education careers adviser        Extremity/Trunk Assessment   Upper Extremity Assessment Upper Extremity Assessment: Overall WFL for tasks assessed;LUE deficits/detail LUE Deficits / Details: left hand grip grossly 5/5 with slight incoordination    Lower Extremity Assessment Lower Extremity Assessment: Overall WFL for tasks assessed    Cervical / Trunk Assessment Cervical / Trunk Assessment: Normal  Communication   Communication: No difficulties  Cognition Arousal/Alertness: Awake/alert Behavior During Therapy: WFL for tasks assessed/performed Overall Cognitive Status:  Within Functional Limits for tasks assessed                                        General Comments      Exercises     Assessment/Plan    PT Assessment Patent does not need any further PT services  PT Problem List         PT Treatment Interventions      PT Goals (Current goals can be found in the Care Plan section)  Acute Rehab PT Goals Patient Stated Goal: return home PT Goal Formulation: With patient Time For  Goal Achievement: 08/29/17 Potential to Achieve Goals: Good    Frequency     Barriers to discharge        Co-evaluation               AM-PAC PT "6 Clicks" Daily Activity  Outcome Measure Difficulty turning over in bed (including adjusting bedclothes, sheets and blankets)?: None Difficulty moving from lying on back to sitting on the side of the bed? : None Difficulty sitting down on and standing up from a chair with arms (e.g., wheelchair, bedside commode, etc,.)?: None Help needed moving to and from a bed to chair (including a wheelchair)?: None Help needed walking in hospital room?: None Help needed climbing 3-5 steps with a railing? : None 6 Click Score: 24    End of Session   Activity Tolerance: Patient tolerated treatment well Patient left: in bed;with call bell/phone within reach;with family/visitor present(seated at bedside) Nurse Communication: Mobility status PT Visit Diagnosis: Unsteadiness on feet (R26.81);Other abnormalities of gait and mobility (R26.89);Muscle weakness (generalized) (M62.81)    Time: 1610-9604 PT Time Calculation (min) (ACUTE ONLY): 19 min   Charges:   PT Evaluation $PT Eval Low Complexity: 1 Low PT Treatments $Gait Training: 8-22 mins   PT G Codes:        12:21 PM, 29-Aug-2017 Ocie Bob, MPT Physical Therapist with [facility] [phone] office [phone] mobile phone

## 2017-08-02 NOTE — Care Management (Signed)
Patient has been seen by PT, OT, SLP. No recommendations. Patient does not have a PCP or insurance. Works as a Designer, fashion/clothing. Provided patient with PCP list and resource list for [facility].

## 2017-08-02 NOTE — Progress Notes (Signed)
Discharge instructions given to patient.  Patient verbalized understanding of discharge instructions.

## 2017-08-02 NOTE — Progress Notes (Signed)
OT Cancellation Note  Patient Details Name: Daniel Castro MRN: [id number] DOB: Aug 23, 1955   Cancelled Treatment:    Reason Eval/Treat Not Completed: OT screened, no needs identified, will sign off. Pt up and moving around room on OT arrival, no difficulty with functional mobility tasks. Pt demonstrating LUE strength at 5/5, slight fine motor coordination deficits noted. Educated pt on fine motor tasks for improving coordination, also stressed importance of using LUE as he usually would. No further OT services required at this time.    Ezra Sites, OTR/L  [phone] 08/02/2017, 8:33 AM

## 2017-08-02 NOTE — Evaluation (Signed)
Speech Language Pathology Evaluation Patient Details Name: Daniel Castro MRN: [id number] DOB: 1955/05/23 Today's Date: 08/02/2017 Time: 1130-1200 SLP Time Calculation (min) (ACUTE ONLY): 30 min  Problem List:  Patient Active Problem List   Diagnosis Date Noted  . Stroke (HCC) 08/01/2017  . HTN (hypertension) 08/01/2017   Past Medical History: History reviewed. No pertinent past medical history. Past Surgical History: History reviewed. No pertinent surgical history. HPI:  Daniel Castro a 62 y.o.malewith medical history significant ofhypertension, was previously on medication, but has been off for years now. Does not seek regular medical care. Patient reports approximately 2 days ago he noticed some difficulty with his speech. He was noticing difficulty chewing his food. Felt as though the inside of his cheek may be numb. Felt as if his tongue was "thick". Did not have any difficulty ambulating. He also noticed that he was dropping things out of his left hand. Denies any changes in vision. No chest pain or shortness of breath. No fever, cough, nausea, vomiting, diarrhea, dysuria. No new medications. MRI shows: 9 mm acute ischemic infarct at the right cerebral peduncle.   Assessment / Plan / Recommendation Clinical Impression  Pt presents with mild dysarthria in setting of mild left lingual and labial weakness. Other cognitive linguistic skills are WNL and Pt functioning at baseline. 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 Pt educated on compensatory speech intelligibility strategies (decrease rate and over articulate) and is independent with implementation. No further SLP services indicated at this time.    SLP Assessment  SLP Recommendation/Assessment: Patient does not need any further Speech Lanaguage Pathology Services SLP Visit Diagnosis: Dysarthria and  anarthria (R47.1)    Follow Up Recommendations  None    Frequency and Duration           SLP Evaluation Cognition  Overall Cognitive Status: Within Functional Limits for tasks assessed Arousal/Alertness: Awake/alert Orientation Level: Oriented X4 Memory: Appears intact Awareness: Appears intact Problem Solving: Appears intact Safety/Judgment: Appears intact       Comprehension  Auditory Comprehension Overall Auditory Comprehension: Appears within functional limits for tasks assessed Yes/No Questions: Within Functional Limits Commands: Within Functional Limits Conversation: Complex Visual Recognition/Discrimination Discrimination: Within Function Limits Reading Comprehension Reading Status: Within funtional limits    Expression Expression Primary Mode of Expression: Verbal Verbal Expression Overall Verbal Expression: Appears within functional limits for tasks assessed Initiation: No impairment Automatic Speech: Name Level of Generative/Spontaneous Verbalization: Conversation Repetition: No impairment Naming: No impairment Pragmatics: No impairment Interfering Components: Speech intelligibility Non-Verbal Means of Communication: Not applicable Written Expression Dominant Hand: Right Written Expression: Not tested   Oral / Motor  Oral Motor/Sensory Function Overall Oral Motor/Sensory Function: Mild impairment Facial ROM: Reduced left Facial Symmetry: Within Functional Limits Facial Strength: Reduced left;Suspected CN VII (facial) dysfunction Facial Sensation: Within Functional Limits Lingual ROM: Within Functional Limits Lingual Symmetry: Within Functional Limits Lingual Strength: Reduced Lingual Sensation: Within Functional Limits Velum: Within Functional Limits Mandible: Within Functional Limits Motor Speech Overall Motor Speech: Impaired Respiration: Within functional limits Phonation: Normal Resonance: Within functional limits Articulation:  Impaired Level of Impairment: Sentence Intelligibility: Intelligibility reduced Word: 75-100% accurate Phrase: 75-100% accurate Sentence: 75-100% accurate Conversation: 75-100% accurate Motor Planning: Witnin functional limits Motor Speech Errors: Aware Effective Techniques: Slow rate;Over-articulate   Thank you,  Havery Moros, CCC-SLP [phone]  Neelam Tiggs 08/02/2017, 12:31 PM

## 2018-10-22 ENCOUNTER — Encounter (HOSPITAL_COMMUNITY): Payer: Self-pay | Admitting: Emergency Medicine

## 2018-10-22 ENCOUNTER — Other Ambulatory Visit: Payer: Self-pay

## 2018-10-22 ENCOUNTER — Emergency Department (HOSPITAL_COMMUNITY): Payer: 59

## 2018-10-22 ENCOUNTER — Emergency Department (HOSPITAL_COMMUNITY)
Admission: EM | Admit: 2018-10-22 | Discharge: 2018-10-22 | Disposition: A | Discharge status: Home / Self Care | Payer: 59 | Attending: Emergency Medicine | Admitting: Emergency Medicine

## 2018-10-22 DIAGNOSIS — I10 Essential (primary) hypertension: Secondary | ICD-10-CM | POA: Insufficient documentation

## 2018-10-22 DIAGNOSIS — Z79899 Other long term (current) drug therapy: Secondary | ICD-10-CM | POA: Insufficient documentation

## 2018-10-22 DIAGNOSIS — N201 Calculus of ureter: Secondary | ICD-10-CM | POA: Insufficient documentation

## 2018-10-22 DIAGNOSIS — Z8673 Personal history of transient ischemic attack (TIA), and cerebral infarction without residual deficits: Secondary | ICD-10-CM | POA: Insufficient documentation

## 2018-10-22 DIAGNOSIS — R109 Unspecified abdominal pain: Secondary | ICD-10-CM

## 2018-10-22 HISTORY — DX: Essential (primary) hypertension: I10

## 2018-10-22 HISTORY — DX: Cerebral infarction, unspecified: I63.9

## 2018-10-22 HISTORY — DX: Disorder of kidney and ureter, unspecified: N28.9

## 2018-10-22 LAB — COMPREHENSIVE METABOLIC PANEL
ALT: 20 U/L (ref 0–44)
AST: <5 U/L — ABNORMAL LOW (ref 15–41)
Albumin: 5 g/dL (ref 3.5–5.0)
Alkaline Phosphatase: 47 U/L (ref 38–126)
Anion gap: 16 — ABNORMAL HIGH (ref 5–15)
BUN: 24 mg/dL — ABNORMAL HIGH (ref 8–23)
CO2: 24 mmol/L (ref 22–32)
Calcium: 9.9 mg/dL (ref 8.9–10.3)
Chloride: 102 mmol/L (ref 98–111)
Creatinine, Ser: 1.36 mg/dL — ABNORMAL HIGH (ref 0.61–1.24)
GFR calc Af Amer: >60 mL/min (ref >60)
GFR calc non Af Amer: 55 mL/min — ABNORMAL LOW (ref >60)
Glucose, Bld: 140 mg/dL — ABNORMAL HIGH (ref 70–99)
Potassium: 4 mmol/L (ref 3.5–5.1)
Sodium: 142 mmol/L (ref 135–145)
Total Bilirubin: 1.4 mg/dL — ABNORMAL HIGH (ref 0.3–1.2)
Total Protein: 8.4 g/dL — ABNORMAL HIGH (ref 6.5–8.1)

## 2018-10-22 LAB — CBC
HCT: 48.1 % (ref 39.0–52.0)
Hemoglobin: 16.1 g/dL (ref 13.0–17.0)
MCH: 28.4 pg (ref 26.0–34.0)
MCHC: 33.5 g/dL (ref 30.0–36.0)
MCV: 84.8 fL (ref 80.0–100.0)
Platelets: 227 10*3/uL (ref 150–400)
RBC: 5.67 MIL/uL (ref 4.22–5.81)
RDW: 12.4 % (ref 11.5–15.5)
WBC: 16.1 10*3/uL — ABNORMAL HIGH (ref 4.0–10.5)
nRBC: 0 % (ref 0.0–0.2)

## 2018-10-22 LAB — URINALYSIS, ROUTINE W REFLEX MICROSCOPIC
Bilirubin Urine: NEGATIVE
Glucose, UA: NEGATIVE mg/dL
Ketones, ur: NEGATIVE mg/dL
Leukocytes,Ua: NEGATIVE
Nitrite: NEGATIVE
Protein, ur: 30 mg/dL — AB
Specific Gravity, Urine: >1.03 — ABNORMAL HIGH (ref 1.005–1.030)
pH: 5.5 (ref 5.0–8.0)

## 2018-10-22 LAB — URINALYSIS, MICROSCOPIC (REFLEX)

## 2018-10-22 LAB — LIPASE, BLOOD: Lipase: 23 U/L (ref 11–51)

## 2018-10-22 MED ORDER — OXYCODONE-ACETAMINOPHEN 5-325 MG PO TABS: 1.0000 | ORAL_TABLET | Freq: Four times a day (QID) | ORAL | 0 refills | Status: DC | PRN

## 2018-10-22 MED ORDER — CEPHALEXIN 500 MG PO CAPS: 500.0000 mg | ORAL_CAPSULE | Freq: Four times a day (QID) | ORAL | 0 refills | Status: AC

## 2018-10-22 MED ORDER — SODIUM CHLORIDE 0.9 % IV BOLUS
500.0000 mL | Freq: Once | INTRAVENOUS | Status: AC
  Administered 2018-10-22: 500 mL via INTRAVENOUS

## 2018-10-22 MED ORDER — HYDROMORPHONE HCL 1 MG/ML IJ SOLN
1.0000 mg | Freq: Once | INTRAMUSCULAR | Status: AC
  Administered 2018-10-22: 1 mg via INTRAVENOUS
  Filled 2018-10-22: qty 1

## 2018-10-22 MED ORDER — CEPHALEXIN 500 MG PO CAPS
500.0000 mg | ORAL_CAPSULE | Freq: Once | ORAL | Status: AC
Start: 2018-10-22 — End: 2018-10-22
  Administered 2018-10-22: 500 mg via ORAL
  Filled 2018-10-22: qty 1

## 2018-10-22 MED ORDER — ONDANSETRON 4 MG PO TBDP: 4.0000 mg | ORAL_TABLET | Freq: Once | ORAL | Status: DC | PRN

## 2018-10-22 MED ORDER — ONDANSETRON 4 MG PO TBDP: ORAL_TABLET | ORAL | 0 refills | Status: DC

## 2018-10-22 MED ORDER — HYDROMORPHONE HCL 1 MG/ML IJ SOLN
1.0000 mg | Freq: Once | INTRAMUSCULAR | Status: DC
  Filled 2018-10-22: qty 1

## 2018-10-22 MED ORDER — ONDANSETRON HCL 4 MG/2ML IJ SOLN
4.0000 mg | Freq: Once | INTRAMUSCULAR | Status: AC
  Administered 2018-10-22: 4 mg via INTRAVENOUS
  Filled 2018-10-22: qty 2

## 2018-10-22 NOTE — ED Triage Notes (Signed)
Pt c/o sudden onset of LT sided flank pain that began around 1600 yesterday. Hx of kidney stones on the RT sided. Pt endorses constant n/v. Denies urinary symptoms, fever or, diarrhea.

## 2018-10-22 NOTE — ED Provider Notes (Signed)
[facility]GENCY DEPARTMENT Provider Note   CSN: [id number] Arrival date & time: 10/22/18  1035     History   Chief Complaint Chief Complaint  Patient presents with   Flank Pain    HPI Daniel Castro is a 63 y.o. male.     Pt complains of left flank pain  The history is provided by the patient. No language interpreter was used.  Flank Pain This is a new problem. The current episode started 12 to 24 hours ago. The problem occurs constantly. The problem has not changed since onset.Pertinent negatives include no chest pain, no abdominal pain and no headaches. Nothing aggravates the symptoms. Nothing relieves the symptoms. He has tried nothing for the symptoms. The treatment provided no relief.    Past Medical History:  Diagnosis Date   Hypertension    Renal disorder    kidney stones   Stroke [facility](HCC)    2019    Patient Active Problem List   Diagnosis Date Noted   Stroke (HCC) 08/01/2017   HTN (hypertension) 08/01/2017    Past Surgical History:  Procedure Laterality Date   CHEST SURGERY     FACIAL COSMETIC SURGERY     MOUTH SURGERY     TONSILLECTOMY          Home Medications    Prior to Admission medications   Medication Sig Start Date End Date Taking? Authorizing Provider  ibuprofen (ADVIL,MOTRIN) 200 MG tablet Take 400 mg by mouth every 6 (six) hours as needed.    [provider]  lisinopril (PRINIVIL,ZESTRIL) 10 MG tablet Take 1 tablet (10 mg total) by mouth daily. 08/02/17   Erick BlinksMemon, Jehanzeb, MD  lovastatin (MEVACOR) 10 MG tablet Take 1 tablet (10 mg total) by mouth at bedtime. 08/02/17   Erick BlinksMemon, Jehanzeb, MD    Family History No family history on file.  Social History Social History   Tobacco Use   Smoking status: Never Smoker   Smokeless tobacco: Never Used  Substance Use Topics   Alcohol use: Not Currently   Drug use: Not Currently     Allergies   Penicillins   Review of Systems Review of Systems    Constitutional: Negative for appetite change and fatigue.  HENT: Negative for congestion, ear discharge and sinus pressure.   Eyes: Negative for discharge.  Respiratory: Negative for cough.   Cardiovascular: Negative for chest pain.  Gastrointestinal: Negative for abdominal pain and diarrhea.  Genitourinary: Positive for flank pain. Negative for frequency and hematuria.  Musculoskeletal: Negative for back pain.  Skin: Negative for rash.  Neurological: Negative for seizures and headaches.  Psychiatric/Behavioral: Negative for hallucinations.     Physical Exam Updated Vital Signs BP (!) 170/113 Comment: Pt continuously rocking   Pulse 93    Temp 98.2 F (36.8 C) (Oral)    Resp 18    Ht 6\' 2"  (1.88 m)    Wt 112.5 kg    SpO2 95%    BMI 31.84 kg/m   Physical Exam Constitutional:      Appearance: He is well-developed.  HENT:     Head: Normocephalic.  Eyes:     General: No scleral icterus.    Conjunctiva/sclera: Conjunctivae normal.  Neck:     Musculoskeletal: Neck supple.     Thyroid: No thyromegaly.  Cardiovascular:     Rate and Rhythm: Normal rate and regular rhythm.     Heart sounds: No murmur. No friction rub. No gallop.   Pulmonary:     Breath sounds:  No stridor. No wheezing or rales.  Chest:     Chest wall: No tenderness.  Abdominal:     General: There is no distension.     Tenderness: There is no abdominal tenderness. There is no rebound.  Genitourinary:    Comments: Tender left flank Musculoskeletal: Normal range of motion.  Lymphadenopathy:     Cervical: No cervical adenopathy.  Skin:    Findings: No erythema or rash.  Neurological:     Mental Status: He is oriented to person, place, and time.     Motor: No abnormal muscle tone.     Coordination: Coordination normal.  Psychiatric:        Behavior: Behavior normal.      ED Treatments / Results  Labs (all labs ordered are listed, but only abnormal results are displayed) Labs Reviewed  COMPREHENSIVE  METABOLIC PANEL - Abnormal; Notable for the following components:      Result Value   Glucose, Bld 140 (*)    BUN 24 (*)    Creatinine, Ser 1.36 (*)    Total Protein 8.4 (*)    AST <5 (*)    Total Bilirubin 1.4 (*)    GFR calc non Af Amer 55 (*)    Anion gap 16 (*)    All other components within normal limits  CBC - Abnormal; Notable for the following components:   WBC 16.1 (*)    All other components within normal limits  URINALYSIS, ROUTINE W REFLEX MICROSCOPIC - Abnormal; Notable for the following components:   Specific Gravity, Urine >1.030 (*)    Hgb urine dipstick LARGE (*)    Protein, ur 30 (*)    All other components within normal limits  URINALYSIS, MICROSCOPIC (REFLEX) - Abnormal; Notable for the following components:   Bacteria, UA FEW (*)    All other components within normal limits  URINE CULTURE  LIPASE, BLOOD    EKG None  Radiology Ct Renal Stone Study  Result Date: 10/22/2018 CLINICAL DATA:  LEFT flank and LEFT lower quadrant abdominal pain since 0400 hours, constant nausea and vomiting, history hypertension, kidney stones EXAM: CT ABDOMEN AND PELVIS WITHOUT CONTRAST TECHNIQUE: Multidetector CT imaging of the abdomen and pelvis was performed following the standard protocol without IV contrast. Sagittal and coronal MPR images reconstructed from axial data set. Oral contrast not administered COMPARISON:  None FINDINGS: Lower chest: Minimal subsegmental atelectasis at LEFT lung base. Calcified granuloma RIGHT middle lobe. Hepatobiliary: Gallbladder and liver normal appearance Pancreas: Normal appearance Spleen: Normal appearance.  Small splenules perisplenic period Adrenals/Urinary Tract: Adrenal glands normal appearance. LEFT hydronephrosis and hydroureter secondary to a 10 x 6 mm mid to distal LEFT ureteral calculus image 77. No additional urinary tract calcifications. Small BILATERAL renal cysts. Stomach/Bowel: Normal appendix. Stomach and bowel loops normal appearance.  Vascular/Lymphatic: Atherosclerotic calcification aorta without aneurysm. No adenopathy. Reproductive: Prostate gland and seminal vesicles unremarkable Other: No free air or free fluid. No definite hernia or inflammatory process. Musculoskeletal: Bones demineralized. IMPRESSION: LEFT hydronephrosis and proximal hydroureter secondary to a 10 x 6 mm mid to distal LEFT ureteral calculus. Small BILATERAL renal cysts. No other intra-abdominal or intrapelvic abnormalities. Electronically Signed   By: Ulyses SouthwardMark  Boles M.D.   On: 10/22/2018 13:01    Procedures Procedures (including critical care time)  Medications Ordered in ED Medications  ondansetron (ZOFRAN-ODT) disintegrating tablet 4 mg (has no administration in time range)  HYDROmorphone (DILAUDID) injection 1 mg (has no administration in time range)  sodium chloride 0.9 %  bolus 500 mL (has no administration in time range)  sodium chloride 0.9 % bolus 500 mL (0 mLs Intravenous Stopped 10/22/18 1906)  ondansetron (ZOFRAN) injection 4 mg (4 mg Intravenous Given 10/22/18 1734)  HYDROmorphone (DILAUDID) injection 1 mg (1 mg Intravenous Given 10/22/18 1734)     Initial Impression / Assessment and Plan / ED Course  I have reviewed the triage vital signs and the nursing notes.  Pertinent labs & imaging results that were available during my care of the patient were reviewed by me and considered in my medical decision making (see chart for details).   dr. Lovena Neighbours in urology stated that the pt should follow up tomorrow with urology.  tx with keflex, percocet and zofran.  Dx kidney stone       Final Clinical Impressions(s) / ED Diagnoses   Final diagnoses:  None    ED Discharge Orders    None       Milton Ferguson, MD 10/22/18 [phone]

## 2018-10-22 NOTE — ED Notes (Signed)
Offered pt ODT Zofran while he was waiting. Pt refused, stating "I just want something for the pain."

## 2018-10-22 NOTE — Discharge Instructions (Addendum)
Call [facility] so you can be seen tomorrow am

## 2018-10-22 NOTE — ED Notes (Signed)
Went in to give patient dilaudid for pain. Patient states "I need to use the bathroom first." Stood beside patient while he used urinal at bedside. Patient urinated approximately 150 ml urine and states "I feel better after I used the bathroom." Patient declined pain medication at this time. States "I feel like it's a little hard to catch my breath. I'm not sure if it's because of the mask or that medication." Advised patient I would return medication and he could request it later if needed. Patient in agreement. Medication returned to pyxis. Patient rates pain at a 1 1/2.

## 2018-10-23 ENCOUNTER — Other Ambulatory Visit: Payer: Self-pay | Admitting: Urology

## 2018-10-23 ENCOUNTER — Encounter (HOSPITAL_COMMUNITY): Payer: Self-pay | Admitting: *Deleted

## 2018-10-23 LAB — URINE CULTURE: Culture: <10000 — AB

## 2018-10-24 MED FILL — Oxycodone w/ Acetaminophen Tab 5-325 MG: ORAL | Qty: 6 | Status: AC

## 2018-10-25 ENCOUNTER — Ambulatory Visit (HOSPITAL_COMMUNITY)
Admission: RE | Admit: 2018-10-25 | Discharge: 2018-10-25 | Disposition: A | Discharge status: Home / Self Care | Payer: Self-pay | Attending: Urology | Admitting: Urology

## 2018-10-25 ENCOUNTER — Encounter (HOSPITAL_COMMUNITY): Payer: Self-pay | Admitting: General Practice

## 2018-10-25 ENCOUNTER — Encounter (HOSPITAL_COMMUNITY)
Admission: RE | Disposition: A | Discharge status: Home / Self Care | Payer: Self-pay | Source: Home / Self Care | Attending: Urology

## 2018-10-25 ENCOUNTER — Ambulatory Visit (HOSPITAL_COMMUNITY): Payer: Self-pay

## 2018-10-25 DIAGNOSIS — N201 Calculus of ureter: Secondary | ICD-10-CM

## 2018-10-25 DIAGNOSIS — E785 Hyperlipidemia, unspecified: Secondary | ICD-10-CM | POA: Insufficient documentation

## 2018-10-25 DIAGNOSIS — Z8673 Personal history of transient ischemic attack (TIA), and cerebral infarction without residual deficits: Secondary | ICD-10-CM | POA: Insufficient documentation

## 2018-10-25 DIAGNOSIS — Z7982 Long term (current) use of aspirin: Secondary | ICD-10-CM | POA: Insufficient documentation

## 2018-10-25 DIAGNOSIS — Z87442 Personal history of urinary calculi: Secondary | ICD-10-CM | POA: Insufficient documentation

## 2018-10-25 DIAGNOSIS — I10 Essential (primary) hypertension: Secondary | ICD-10-CM | POA: Insufficient documentation

## 2018-10-25 DIAGNOSIS — Z79899 Other long term (current) drug therapy: Secondary | ICD-10-CM | POA: Insufficient documentation

## 2018-10-25 DIAGNOSIS — N132 Hydronephrosis with renal and ureteral calculous obstruction: Secondary | ICD-10-CM | POA: Insufficient documentation

## 2018-10-25 HISTORY — PX: EXTRACORPOREAL SHOCK WAVE LITHOTRIPSY: SHX1557

## 2018-10-25 HISTORY — DX: Personal history of urinary calculi: Z87.442

## 2018-10-25 SURGERY — LITHOTRIPSY, ESWL
Anesthesia: LOCAL | Laterality: Left

## 2018-10-25 MED ORDER — DIAZEPAM 5 MG PO TABS
10.0000 mg | ORAL_TABLET | ORAL | Status: AC
  Administered 2018-10-25: 10 mg via ORAL
  Filled 2018-10-25: qty 2

## 2018-10-25 MED ORDER — DIPHENHYDRAMINE HCL 25 MG PO CAPS
25.0000 mg | ORAL_CAPSULE | ORAL | Status: AC
  Administered 2018-10-25: 08:00:00 25 mg via ORAL
  Filled 2018-10-25: qty 1

## 2018-10-25 MED ORDER — CIPROFLOXACIN HCL 500 MG PO TABS
500.0000 mg | ORAL_TABLET | ORAL | Status: AC
  Administered 2018-10-25: 500 mg via ORAL
  Filled 2018-10-25: qty 1

## 2018-10-25 MED ORDER — OXYCODONE-ACETAMINOPHEN 5-325 MG PO TABS: 1.0000 | ORAL_TABLET | Freq: Four times a day (QID) | ORAL | 0 refills | Status: DC | PRN

## 2018-10-25 MED ORDER — OXYCODONE-ACETAMINOPHEN 5-325 MG PO TABS: 1.0000 | ORAL_TABLET | ORAL | 0 refills | Status: DC | PRN

## 2018-10-25 MED ORDER — TAMSULOSIN HCL 0.4 MG PO CAPS: 0.4000 mg | ORAL_CAPSULE | Freq: Every day | ORAL | 1 refills | Status: DC

## 2018-10-25 MED ORDER — TAMSULOSIN HCL 0.4 MG PO CAPS: 0.4000 mg | ORAL_CAPSULE | Freq: Every day | ORAL | 0 refills | Status: DC

## 2018-10-25 MED ORDER — SODIUM CHLORIDE 0.9 % IV SOLN
INTRAVENOUS | Status: DC
  Administered 2018-10-25: 07:00:00 via INTRAVENOUS

## 2018-10-26 ENCOUNTER — Encounter (HOSPITAL_COMMUNITY): Payer: Self-pay | Admitting: Urology

## 2018-12-04 ENCOUNTER — Other Ambulatory Visit: Payer: Self-pay | Admitting: Urology

## 2018-12-11 ENCOUNTER — Encounter (HOSPITAL_BASED_OUTPATIENT_CLINIC_OR_DEPARTMENT_OTHER): Payer: Self-pay

## 2018-12-11 ENCOUNTER — Other Ambulatory Visit (HOSPITAL_COMMUNITY)
Admission: RE | Admit: 2018-12-11 | Discharge: 2018-12-11 | Disposition: A | Discharge status: Home / Self Care | Payer: 59 | Source: Ambulatory Visit | Attending: Urology | Admitting: Urology

## 2018-12-11 ENCOUNTER — Other Ambulatory Visit: Payer: Self-pay

## 2018-12-11 DIAGNOSIS — Z01812 Encounter for preprocedural laboratory examination: Secondary | ICD-10-CM | POA: Insufficient documentation

## 2018-12-11 DIAGNOSIS — Z20828 Contact with and (suspected) exposure to other viral communicable diseases: Secondary | ICD-10-CM | POA: Insufficient documentation

## 2018-12-11 NOTE — Progress Notes (Signed)
Spoke with:  Pilar Plate NPO:  After Midnight, no gum, candy, or mints   Arrival time:  1000AM Labs: KUB, EKG, Istat 8 AM medications: None Pre op orders: Yes Ride home:  Lynelle Smoke (girlfriend) [phone]

## 2018-12-11 NOTE — Progress Notes (Signed)
SPOKE W/  Pilar Plate     SCREENING SYMPTOMS OF COVID 19:   COUGH--NO  RUNNY NOSE--- NO  SORE THROAT---NO  NASAL CONGESTION----NO  SNEEZING----NO  SHORTNESS OF BREATH---NO  DIFFICULTY BREATHING---NO  TEMP >100.0 -----NO  UNEXPLAINED BODY ACHES------NO  CHILLS -------- NO  HEADACHES ---------NO  LOSS OF SMELL/ TASTE --------Yes, Since Lithotripsy 10/2018    HAVE YOU OR ANY FAMILY MEMBER TRAVELLED PAST 14 DAYS OUT OF THE   COUNTY---Live in [place] STATE----Traveled to [place] and [place] COUNTRY----NO  HAVE YOU OR ANY FAMILY MEMBER BEEN EXPOSED TO ANYONE WITH COVID 19? NO

## 2018-12-12 LAB — NOVEL CORONAVIRUS, NAA (HOSP ORDER, SEND-OUT TO REF LAB; TAT 18-24 HRS): SARS-CoV-2, NAA: NOT DETECTED

## 2018-12-14 ENCOUNTER — Ambulatory Visit (HOSPITAL_COMMUNITY): Payer: Self-pay

## 2018-12-14 ENCOUNTER — Encounter (HOSPITAL_BASED_OUTPATIENT_CLINIC_OR_DEPARTMENT_OTHER)
Admission: RE | Disposition: A | Discharge status: Home / Self Care | Payer: Self-pay | Source: Home / Self Care | Attending: Urology

## 2018-12-14 ENCOUNTER — Encounter (HOSPITAL_BASED_OUTPATIENT_CLINIC_OR_DEPARTMENT_OTHER): Payer: Self-pay | Admitting: Anesthesiology

## 2018-12-14 ENCOUNTER — Ambulatory Visit (HOSPITAL_BASED_OUTPATIENT_CLINIC_OR_DEPARTMENT_OTHER): Payer: Self-pay | Admitting: Anesthesiology

## 2018-12-14 ENCOUNTER — Ambulatory Visit (HOSPITAL_BASED_OUTPATIENT_CLINIC_OR_DEPARTMENT_OTHER)
Admission: RE | Admit: 2018-12-14 | Discharge: 2018-12-14 | Disposition: A | Discharge status: Home / Self Care | Payer: Self-pay | Attending: Urology | Admitting: Urology

## 2018-12-14 DIAGNOSIS — N132 Hydronephrosis with renal and ureteral calculous obstruction: Secondary | ICD-10-CM | POA: Insufficient documentation

## 2018-12-14 DIAGNOSIS — Z79899 Other long term (current) drug therapy: Secondary | ICD-10-CM | POA: Insufficient documentation

## 2018-12-14 DIAGNOSIS — Z539 Procedure and treatment not carried out, unspecified reason: Secondary | ICD-10-CM | POA: Insufficient documentation

## 2018-12-14 DIAGNOSIS — N201 Calculus of ureter: Secondary | ICD-10-CM

## 2018-12-14 DIAGNOSIS — Z8673 Personal history of transient ischemic attack (TIA), and cerebral infarction without residual deficits: Secondary | ICD-10-CM | POA: Insufficient documentation

## 2018-12-14 DIAGNOSIS — I10 Essential (primary) hypertension: Secondary | ICD-10-CM | POA: Insufficient documentation

## 2018-12-14 HISTORY — DX: Other complications of anesthesia, initial encounter: T88.59XA

## 2018-12-14 LAB — POCT I-STAT, CHEM 8
BUN: 26 mg/dL — ABNORMAL HIGH (ref 8–23)
Calcium, Ion: 1.25 mmol/L (ref 1.15–1.40)
Chloride: 106 mmol/L (ref 98–111)
Creatinine, Ser: 1.5 mg/dL — ABNORMAL HIGH (ref 0.61–1.24)
Glucose, Bld: 101 mg/dL — ABNORMAL HIGH (ref 70–99)
HCT: 41 % (ref 39.0–52.0)
Hemoglobin: 13.9 g/dL (ref 13.0–17.0)
Potassium: 4.4 mmol/L (ref 3.5–5.1)
Sodium: 141 mmol/L (ref 135–145)
TCO2: 24 mmol/L (ref 22–32)

## 2018-12-14 SURGERY — CYSTOSCOPY/URETEROSCOPY/HOLMIUM LASER/STENT PLACEMENT
Anesthesia: General | Laterality: Left

## 2018-12-14 MED ORDER — CIPROFLOXACIN IN D5W 400 MG/200ML IV SOLN
INTRAVENOUS | Status: AC
  Filled 2018-12-14: qty 200

## 2018-12-14 MED ORDER — LIDOCAINE 2% (20 MG/ML) 5 ML SYRINGE
INTRAMUSCULAR | Status: AC
  Filled 2018-12-14: qty 5

## 2018-12-14 MED ORDER — OXYCODONE HCL 5 MG PO TABS
5.0000 mg | ORAL_TABLET | Freq: Once | ORAL | Status: DC | PRN
  Filled 2018-12-14: qty 1

## 2018-12-14 MED ORDER — ACETAMINOPHEN 10 MG/ML IV SOLN
1000.0000 mg | Freq: Once | INTRAVENOUS | Status: DC | PRN
  Filled 2018-12-14: qty 100

## 2018-12-14 MED ORDER — CIPROFLOXACIN IN D5W 400 MG/200ML IV SOLN
400.0000 mg | INTRAVENOUS | Status: DC
  Filled 2018-12-14: qty 200

## 2018-12-14 MED ORDER — LACTATED RINGERS IV SOLN
INTRAVENOUS | Status: DC
  Administered 2018-12-14: 11:00:00 via INTRAVENOUS
  Filled 2018-12-14: qty 1000

## 2018-12-14 MED ORDER — ONDANSETRON HCL 4 MG/2ML IJ SOLN
INTRAMUSCULAR | Status: AC
  Filled 2018-12-14: qty 2

## 2018-12-14 MED ORDER — OXYCODONE HCL 5 MG/5ML PO SOLN
5.0000 mg | Freq: Once | ORAL | Status: DC | PRN
  Filled 2018-12-14: qty 5

## 2018-12-14 MED ORDER — FENTANYL CITRATE (PF) 100 MCG/2ML IJ SOLN
25.0000 ug | INTRAMUSCULAR | Status: DC | PRN
  Filled 2018-12-14: qty 1

## 2018-12-14 MED ORDER — DEXAMETHASONE SODIUM PHOSPHATE 10 MG/ML IJ SOLN
INTRAMUSCULAR | Status: AC
  Filled 2018-12-14: qty 1

## 2018-12-14 MED ORDER — FENTANYL CITRATE (PF) 100 MCG/2ML IJ SOLN
INTRAMUSCULAR | Status: AC
  Filled 2018-12-14: qty 2

## 2018-12-14 MED ORDER — PROPOFOL 10 MG/ML IV BOLUS
INTRAVENOUS | Status: AC
  Filled 2018-12-14: qty 20

## 2018-12-14 MED ORDER — ACETAMINOPHEN 160 MG/5ML PO SOLN
1000.0000 mg | Freq: Once | ORAL | Status: DC | PRN
  Filled 2018-12-14: qty 40.6

## 2018-12-14 MED ORDER — ACETAMINOPHEN 500 MG PO TABS
1000.0000 mg | ORAL_TABLET | Freq: Once | ORAL | Status: DC | PRN
  Filled 2018-12-14: qty 2

## 2018-12-14 SURGICAL SUPPLY — 17 items
BAG DRAIN URO-CYSTO SKYTR STRL (DRAIN) IMPLANT
CATH INTERMIT  6FR 70CM (CATHETERS) IMPLANT
CLOTH BEACON ORANGE TIMEOUT ST (SAFETY) IMPLANT
FIBER LASER FLEXIVA 365 (UROLOGICAL SUPPLIES) IMPLANT
FIBER LASER TRAC TIP (UROLOGICAL SUPPLIES) IMPLANT
GLOVE BIO SURGEON STRL SZ7.5 (GLOVE) IMPLANT
GOWN STRL REUS W/TWL XL LVL3 (GOWN DISPOSABLE) IMPLANT
GUIDEWIRE STR DUAL SENSOR (WIRE) IMPLANT
IV NS 1000ML (IV SOLUTION)
IV NS 1000ML BAXH (IV SOLUTION) IMPLANT
IV NS IRRIG 3000ML ARTHROMATIC (IV SOLUTION) IMPLANT
KIT TURNOVER CYSTO (KITS) IMPLANT
MANIFOLD NEPTUNE II (INSTRUMENTS) IMPLANT
NS IRRIG 500ML POUR BTL (IV SOLUTION) IMPLANT
PACK CYSTO (CUSTOM PROCEDURE TRAY) IMPLANT
TUBE CONNECTING 12X1/4 (SUCTIONS) IMPLANT
TUBING UROLOGY SET (TUBING) IMPLANT

## 2018-12-14 NOTE — Progress Notes (Signed)
Surgery cancelled by Dr. Gloriann Loan. IV removed, pt dressing. Tammy called for pick up.

## 2018-12-14 NOTE — H&P (Signed)
CC: I have had kidney stone surgery.  HPI: Daniel Castro is a 63 year-old male established patient who is here for renal calculi after a surgical intervention.  11/08/2018: Follow-up today after lithotripsy for a distal left ureteral calculi. He tolerated the procedure well and did not have any pain or discomfort postoperatively. After mowing his yard a couple of days ago he began passing some stone material and brings those in. They are adhered to the strainer screen but a moderate amount of fragments are visualized. 3-4 days ago he began developing some nausea and diarrhea with increased painful urgency but not having any colicky pain on the affected side. He feels this may be related to his ongoing use of tamsulosin. Denies fevers or chills, burning or painful urination, gross hematuria.   12/03/2018: He f/u today for repeat imaging and assessment. His treated calculi had fragmented some but had yet to clear on KUB performed at last OV. I had him continue tamsulosin stating I felt his increased symptoms were likely being caused by him trying to pass the treated stone and not as a true SE of the medication.   Over the past 3 weeks he continued have intermittent left lower abdominal pain and discomfort radiating into the right flank with associated increased frequency/urgency and burning with urination. He tells me he passed his stone fragments over this past weekend with significant improvement in symptoms. He feels well today. Not having any painful urgency, burning or painful urination, visible blood in the urine, fevers or chills, nausea/vomiting.   The stone was on the left side. He had ESWL for treatment of his renal calculi. Patient denies Stent, Ureteroscopy, and PCNL. This procedure was done 10/25/2018. He did pass a stone since the last office visit . This is not his first kidney stone. He does not have a stent in place.   He is not currently having flank pain, back pain, groin pain, nausea,  vomiting, fever or chills.   He does not have dysuria. He does have urgency. He does not have frequency.     ALLERGIES: Penicillin    MEDICATIONS: Aspirin 81 mg tablet,chewable  Lisinopril 20 mg tablet  Lovastatin 20 mg tablet     GU PSH: ESWL, Left - 10/25/2018       PSH Notes: Removal of glass (fell through a glass door) 1965   NON-GU PSH: Tonsillectomy - 1964     GU PMH: Renal colic - 04/25/2351 Ureteral calculus - 10/23/2018 Renal calculus    NON-GU PMH: Gout Hypertension Neoplasm of unspecified behavior of bone, soft tissue, and skin Stroke/TIA    FAMILY HISTORY: 2 daughters - Daughter Diabetes - Sister father living - Father Hematuria - Sister Kidney Stones - Father, Daughter mother deceased - Mother Prostate Cancer - Father renal failure - Sister    Notes: Mother deceased at age 64 d/t complications from back surgery  Father living at age 85   SOCIAL HISTORY: Marital Status: Widowed Current Smoking Status: Patient has never smoked.   Tobacco Use Assessment Completed: Used Tobacco in last 30 days? Has never drank.  Drinks 1 caffeinated drink per day. 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    REVIEW OF SYSTEMS:    GU Review Male:   Patient denies frequent urination, hard to postpone urination, burning/ pain with urination, get up at night to urinate, leakage of urine, stream starts and stops, trouble starting your stream, have to strain to urinate , erection problems, and penile pain.  Gastrointestinal (Upper):  Patient denies nausea, vomiting, and indigestion/ heartburn.  Gastrointestinal (Lower):   Patient denies diarrhea and constipation.  Constitutional:   Patient denies fever, night sweats, weight loss, and fatigue.  Skin:   Patient denies skin rash/ lesion and itching.  Eyes:   Patient denies blurred vision and double vision.  Ears/ Nose/ Throat:   Patient denies sore throat and sinus problems.  Hematologic/Lymphatic:   Patient denies swollen  glands and easy bruising.  Cardiovascular:   Patient denies leg swelling and chest pains.  Respiratory:   Patient denies cough and shortness of breath.  Endocrine:   Patient denies excessive thirst.  Musculoskeletal:   flank and abdominal pain. Patient denies back pain and joint pain.  Neurological:   Patient denies headaches and dizziness.  Psychologic:   Patient denies depression and anxiety.   Notes: Updated from previous visit 11/08/2018 with review from patient as noted above.   VITAL SIGNS:      12/03/2018 02:47 PM  BP 138/83 mmHg  Pulse 66 /min  Temperature 97.7 F / 36.5 C   MULTI-SYSTEM PHYSICAL EXAMINATION:    Constitutional: Well-nourished. No physical deformities. Normally developed. Good grooming.  Neck: Neck symmetrical, not swollen. Normal tracheal position.  Respiratory: No labored breathing, no use of accessory muscles.   Cardiovascular: Normal temperature, normal extremity pulses, no swelling, no varicosities.  Skin: No paleness, no jaundice, no cyanosis. No lesion, no ulcer, no rash.  Neurologic / Psychiatric: Oriented to time, oriented to place, oriented to person. No depression, no anxiety, no agitation.  Gastrointestinal: Obese abdomen. No mass, no tenderness, no rigidity. No CVA, flank, or lower abdominal tenderness.  Musculoskeletal: Normal gait and station of head and neck.     PAST DATA REVIEWED:  Source Of History:  Patient, Medical Record Summary  Records Review:   Previous Hospital Records, Previous Patient Records  Urine Test Review:   Urinalysis, Urine Culture  X-Ray Review: KUB: Reviewed Films. Discussed With Patient.  Renal Ultrasound (Limited): Reviewed Films. Discussed With Patient.     12/03/18 11/08/18 11/08/18  Urinalysis  Urine Appearance Clear  Clear  Clear   Urine Color Yellow  Yellow  Yellow   Urine Glucose Neg mg/dL Neg mg/dL Neg   Urine Bilirubin Neg mg/dL Neg mg/dL Neg   Urine Ketones Neg mg/dL Neg mg/dL Neg   Urine Specific Gravity  1.025  1.025  1.025   Urine Blood Neg ery/uL Neg ery/uL Neg   Urine pH 6.0  <=5.0  <=5.0   Urine Protein Neg mg/dL Neg mg/dL Neg   Urine Urobilinogen 0.2 mg/dL 0.2 mg/dL 0.2   Urine Nitrites Neg  Neg  Neg   Urine Leukocyte Esterase Neg leu/uL Neg leu/uL Neg    PROCEDURES:         Renal Ultrasound (Limited) - 16109- [id number]  LT Kidney: Length: 11.9 cm Depth: 6.1 cm Cortical Width: 1.7 cm Width: 6.6 cm    Left Kidney/Ureter:  Hydro noted, Cystic area UP= 2.8x2.4x2.9cm  Bladder:  PVR= 122.55ML. ? Stone seen in bladder= 0.91cm, ? UVJ stone?=0.33cm                KUB - 74018  A single view of the abdomen is obtained. The previously noted stone at the distal portion of the left mid ureter overlying the superior portion of the left sacral wing has fragmented more migrating down to the anatomical expected location of the left UVJ. There is a stone also noted within the dependent portion of the bladder.  Patient confirmed No Neulasta OnPro Device.           Urinalysis Dipstick Dipstick Cont'd  Color: Yellow Bilirubin: Neg mg/dL  Appearance: Clear Ketones: Neg mg/dL  Specific Gravity: 7.741 Blood: Neg ery/uL  pH: 6.0 Protein: Neg mg/dL  Glucose: Neg mg/dL Urobilinogen: 0.2 mg/dL    Nitrites: Neg    Leukocyte Esterase: Neg leu/uL    ASSESSMENT:      ICD-10 Details  1 GU:   Ureteral calculus - N20.1    PLAN:           Orders Labs Urine Culture          Schedule Return Visit/Planned Activity: 1-2 Weeks - Follow up MD, Schedule Surgery          Document Letter(s):  Created for Patient: Clinical Summary         Notes:   Now 5 weeks s/p ESWL for a distal left ureteral calculi. Treated calculus has migrated further down the ureter with a collection of stone material noted at the left UVJ and a likely passed stone in the dependent portion of the bladder as noted on KUB today. U/S today with hydronephrosis, likely from the obstructing stone material. Given his continued symptoms,  he would benefit from URS to definitively treat the obstructing stone burden.  For ureteroscopy I described the risks which include heart attack, stroke, pulmonary embolus, death, bleeding, infection, damage to contiguous structures, positioning injury, ureteral stricture, ureteral avulsion, ureteral injury, need for ureteral stent, inability to perform ureteroscopy, need for an interval procedure, inability to clear stone burden, stent discomfort and pain.   I'll discuss his presentation further with Dr Alvester Morin and have the patient contacted regarding plan of care moving forward. He will need to continue tamsulosin, remain well hydrated and nourished. He has pain medication on hand with severe exacerbations of pain/discomfort. In the event he passes more stone material, not excluding the one noted in the bladder today, he can return for f/u imaging to ensure his stone burden has cleared.    Signed by Anne Fu, NP on 12/03/18 at 3:29 PM (EDT)

## 2019-01-26 ENCOUNTER — Emergency Department (HOSPITAL_COMMUNITY): Payer: Self-pay

## 2019-01-26 ENCOUNTER — Other Ambulatory Visit: Payer: Self-pay

## 2019-01-26 ENCOUNTER — Encounter (HOSPITAL_COMMUNITY): Payer: Self-pay | Admitting: Emergency Medicine

## 2019-01-26 ENCOUNTER — Emergency Department (HOSPITAL_COMMUNITY)
Admission: EM | Admit: 2019-01-26 | Discharge: 2019-01-26 | Disposition: A | Discharge status: Home / Self Care | Payer: Self-pay | Attending: Emergency Medicine | Admitting: Emergency Medicine

## 2019-01-26 DIAGNOSIS — Z8673 Personal history of transient ischemic attack (TIA), and cerebral infarction without residual deficits: Secondary | ICD-10-CM | POA: Insufficient documentation

## 2019-01-26 DIAGNOSIS — Z20828 Contact with and (suspected) exposure to other viral communicable diseases: Secondary | ICD-10-CM | POA: Insufficient documentation

## 2019-01-26 DIAGNOSIS — Z7982 Long term (current) use of aspirin: Secondary | ICD-10-CM | POA: Insufficient documentation

## 2019-01-26 DIAGNOSIS — R079 Chest pain, unspecified: Secondary | ICD-10-CM | POA: Insufficient documentation

## 2019-01-26 DIAGNOSIS — I10 Essential (primary) hypertension: Secondary | ICD-10-CM | POA: Insufficient documentation

## 2019-01-26 DIAGNOSIS — R0602 Shortness of breath: Secondary | ICD-10-CM | POA: Insufficient documentation

## 2019-01-26 DIAGNOSIS — R091 Pleurisy: Secondary | ICD-10-CM | POA: Insufficient documentation

## 2019-01-26 DIAGNOSIS — J189 Pneumonia, unspecified organism: Secondary | ICD-10-CM

## 2019-01-26 DIAGNOSIS — Z79899 Other long term (current) drug therapy: Secondary | ICD-10-CM | POA: Insufficient documentation

## 2019-01-26 LAB — SARS CORONAVIRUS 2 (TAT 6-24 HRS): SARS Coronavirus 2: NEGATIVE

## 2019-01-26 LAB — BASIC METABOLIC PANEL
Anion gap: 12 (ref 5–15)
BUN: 20 mg/dL (ref 8–23)
CO2: 22 mmol/L (ref 22–32)
Calcium: 9.4 mg/dL (ref 8.9–10.3)
Chloride: 101 mmol/L (ref 98–111)
Creatinine, Ser: 1.45 mg/dL — ABNORMAL HIGH (ref 0.61–1.24)
GFR calc Af Amer: 59 mL/min — ABNORMAL LOW (ref >60)
GFR calc non Af Amer: 51 mL/min — ABNORMAL LOW (ref >60)
Glucose, Bld: 140 mg/dL — ABNORMAL HIGH (ref 70–99)
Potassium: 4.3 mmol/L (ref 3.5–5.1)
Sodium: 135 mmol/L (ref 135–145)

## 2019-01-26 LAB — CBC
HCT: 43.9 % (ref 39.0–52.0)
Hemoglobin: 14.8 g/dL (ref 13.0–17.0)
MCH: 28.8 pg (ref 26.0–34.0)
MCHC: 33.7 g/dL (ref 30.0–36.0)
MCV: 85.4 fL (ref 80.0–100.0)
Platelets: 226 10*3/uL (ref 150–400)
RBC: 5.14 MIL/uL (ref 4.22–5.81)
RDW: 12.7 % (ref 11.5–15.5)
WBC: 12.5 10*3/uL — ABNORMAL HIGH (ref 4.0–10.5)
nRBC: 0 % (ref 0.0–0.2)

## 2019-01-26 LAB — D-DIMER, QUANTITATIVE: D-Dimer, Quant: 0.56 ug/mL-FEU — ABNORMAL HIGH (ref 0.00–0.50)

## 2019-01-26 LAB — TROPONIN I (HIGH SENSITIVITY)
Troponin I (High Sensitivity): 4 ng/L (ref <18)
Troponin I (High Sensitivity): 4 ng/L (ref <18)

## 2019-01-26 MED ORDER — DOXYCYCLINE HYCLATE 100 MG PO TABS
100.0000 mg | ORAL_TABLET | Freq: Once | ORAL | Status: AC
  Administered 2019-01-26: 08:00:00 100 mg via ORAL
  Filled 2019-01-26: qty 1

## 2019-01-26 MED ORDER — KETOROLAC TROMETHAMINE 30 MG/ML IJ SOLN
30.0000 mg | Freq: Once | INTRAMUSCULAR | Status: AC
  Administered 2019-01-26: 30 mg via INTRAVENOUS
  Filled 2019-01-26: qty 1

## 2019-01-26 MED ORDER — HYDROCODONE-ACETAMINOPHEN 5-325 MG PO TABS
1.0000 | ORAL_TABLET | Freq: Once | ORAL | Status: AC
  Administered 2019-01-26: 1 via ORAL
  Filled 2019-01-26: qty 1

## 2019-01-26 MED ORDER — CYCLOBENZAPRINE HCL 10 MG PO TABS: 10.0000 mg | ORAL_TABLET | Freq: Two times a day (BID) | ORAL | 0 refills | Status: AC | PRN

## 2019-01-26 MED ORDER — DOXYCYCLINE HYCLATE 100 MG PO CAPS: 100.0000 mg | ORAL_CAPSULE | Freq: Two times a day (BID) | ORAL | 0 refills | Status: AC

## 2019-01-26 MED ORDER — SODIUM CHLORIDE 0.9% FLUSH: 3.0000 mL | Freq: Once | INTRAVENOUS | Status: DC

## 2019-01-26 MED ORDER — FENTANYL CITRATE (PF) 100 MCG/2ML IJ SOLN
50.0000 ug | Freq: Once | INTRAMUSCULAR | Status: AC
  Administered 2019-01-26: 50 ug via INTRAVENOUS
  Filled 2019-01-26: qty 2

## 2019-01-26 NOTE — Discharge Instructions (Signed)
Your work-up today was consistent with muscle skeletal pain likely related to a subtle pneumonia.  You are treated with antibiotics, please take antibiotics as prescribed.  You may also use the muscle relaxant to help with like musculoskeletal comfort and please follow-up with your primary doctor.  If any symptoms change or worsen, please return to the nearest emergency department.

## 2019-01-26 NOTE — ED Provider Notes (Signed)
[facility] EMERGENCY DEPARTMENT Provider Note   CSN: [id number] Arrival date & time: 01/26/19  0325     History   Chief Complaint Chief Complaint  Patient presents with  . Shoulder Pain  . Chest Pain    HPI Daniel Castro is a 63 y.o. male.     The history is provided by the patient.  Shoulder Pain Location:  Shoulder Shoulder location:  R shoulder Injury: no   Pain details:    Quality:  Sharp   Radiates to:  Chest   Severity:  Severe   Onset quality:  Sudden   Duration:  1 day   Timing:  Intermittent   Progression:  Worsening Relieved by:  Nothing Exacerbated by: Breathing. Associated symptoms: no fever   Chest Pain Associated symptoms: shortness of breath   Associated symptoms: no cough and no fever    Patient with history of stroke presents with right scapula and right chest pain.  He reports yesterday he had sudden onset of sharp right scapular pain and denies trauma.  Since that time has had intermittent episodes of right-sided chest pain is worse with breathing.  No fevers or cough.  No hemoptysis. He had otherwise been well recently.  No known history of CAD/VTE.  He is typically very active and does not have any pain with activity Past Medical History:  Diagnosis Date  . Complication of anesthesia    prolonged sedation  . Grade I diastolic dysfunction 07/2017   noted on ECHO  . History of kidney stones   . Hypertension   . LVH (left ventricular hypertrophy) 07/2017   Moderate, noted on ECHO  . Renal disorder    kidney stones  . Stroke [facility])    2019- left side    Patient Active Problem List   Diagnosis Date Noted  . Stroke (HCC) 08/01/2017  . HTN (hypertension) 08/01/2017    Past Surgical History:  Procedure Laterality Date  . CHEST SURGERY     taking out glass from a glass door as a child  . EXTRACORPOREAL SHOCK WAVE LITHOTRIPSY Left 10/25/2018   Procedure: EXTRACORPOREAL SHOCK WAVE LITHOTRIPSY (ESWL);   Surgeon: Malen Gauze, MD;  Location: [facility] ORS;  Service: Urology;  Laterality: Left;  . FACIAL COSMETIC SURGERY    . MOUTH SURGERY    . TONSILLECTOMY    . WISDOM TOOTH EXTRACTION          Home Medications    Prior to Admission medications   Medication Sig Start Date End Date Taking? Authorizing Provider  aspirin EC 81 MG tablet Take 81 mg by mouth daily.    [provider]  cephALEXin (KEFLEX) 500 MG capsule Take 1 capsule (500 mg total) by mouth 4 (four) times daily. 10/22/18   Bethann Berkshire, MD  ibuprofen (ADVIL,MOTRIN) 200 MG tablet Take 400 mg by mouth every 6 (six) hours as needed.    [provider]  lisinopril (PRINIVIL,ZESTRIL) 10 MG tablet Take 1 tablet (10 mg total) by mouth daily. 08/02/17   Erick Blinks, MD  lovastatin (MEVACOR) 10 MG tablet Take 1 tablet (10 mg total) by mouth at bedtime. 08/02/17   Erick Blinks, MD    Family History No family history on file.  Social History Social History   Tobacco Use  . Smoking status: Never Smoker  . Smokeless tobacco: Never Used  Substance Use Topics  . Alcohol use: Not Currently  . Drug use: Not Currently     Allergies  Penicillins   Review of Systems Review of Systems  Constitutional: Negative for fever.  Respiratory: Positive for shortness of breath. Negative for cough.   Cardiovascular: Positive for chest pain.  All other systems reviewed and are negative.    Physical Exam Updated Vital Signs BP 117/75   Pulse 70   Temp 98.4 F (36.9 C)   Resp 20   SpO2 96%   Physical Exam CONSTITUTIONAL: Well developed/well nourished appears uncomfortable HEAD: Normocephalic/atraumatic EYES: EOMI/PERRL ENMT: Mucous membranes moist NECK: supple no meningeal signs SPINE/BACK:entire spine nontender CV: S1/S2 noted, no murmurs/rubs/gallops noted LUNGS: Lungs are clear to auscultation bilaterally, no apparent distress ABDOMEN: soft, nontender, no rebound or guarding, bowel sounds noted  throughout abdomen GU:no cva tenderness NEURO: Pt is awake/alert/appropriate, moves all extremitiesx4.  No facial droop.   EXTREMITIES: pulses normal/equal, full ROM, there is no tenderness to right shoulder or right scapula.  No tenderness to chest. SKIN: warm, color normal PSYCH: no abnormalities of mood noted, alert and oriented to situation   ED Treatments / Results  Labs (all labs ordered are listed, but only abnormal results are displayed) Labs Reviewed  BASIC METABOLIC PANEL - Abnormal; Notable for the following components:      Result Value   Glucose, Bld 140 (*)    Creatinine, Ser 1.45 (*)    GFR calc non Af Amer 51 (*)    GFR calc Af Amer 59 (*)    All other components within normal limits  CBC - Abnormal; Notable for the following components:   WBC 12.5 (*)    All other components within normal limits  SARS CORONAVIRUS 2 (TAT 6-24 HRS)  D-DIMER, QUANTITATIVE (NOT AT [facility])  TROPONIN I (HIGH SENSITIVITY)    EKG EKG Interpretation  Date/Time:  Saturday January 26 2019 03:43:36 EST Ventricular Rate:  74 PR Interval:    QRS Duration: 93 QT Interval:  386 QTC Calculation: 429 R Axis:   12 Text Interpretation: Sinus rhythm Low voltage, precordial leads Interpretation limited secondary to artifact needs repeat ekg Confirmed by Ripley Fraise [phone]) on 01/26/2019 3:52:40 AM   Radiology Dg Chest Port 1 View  Result Date: 01/26/2019 CLINICAL DATA:  Initial evaluation for acute chest pain. EXAM: PORTABLE CHEST 1 VIEW COMPARISON:  None. FINDINGS: Cardiomegaly. Mediastinal silhouette within normal limits. Lungs hypoinflated. Associated mild bibasilar atelectasis. Mild diffuse pulmonary interstitial congestion without frank pulmonary edema. Probable small right pleural effusion. No consolidative opacity. No pneumothorax. No acute osseous finding. IMPRESSION: 1. Shallow lung inflation with associated mild bibasilar atelectasis. 2. Mild diffuse pulmonary interstitial  congestion without frank pulmonary edema. 3. Probable small right pleural effusion. Electronically Signed   By: Jeannine Boga M.D.   On: 01/26/2019 04:12    Procedures Procedures   Medications Ordered in ED Medications  sodium chloride flush (NS) 0.9 % injection 3 mL (has no administration in time range)  ketorolac (TORADOL) 30 MG/ML injection 30 mg (has no administration in time range)  doxycycline (VIBRA-TABS) tablet 100 mg (has no administration in time range)  HYDROcodone-acetaminophen (NORCO/VICODIN) 5-325 MG per tablet 1 tablet (1 tablet Oral Given 01/26/19 0530)  fentaNYL (SUBLIMAZE) injection 50 mcg (50 mcg Intravenous Given 01/26/19 0655)     Initial Impression / Assessment and Plan / ED Course  I have reviewed the triage vital signs and the nursing notes.  Pertinent labs & imaging results that were available during my care of the patient were reviewed by me and considered in my medical decision making (see chart for  details).        5:45 AM Patient presents for sudden onset of right scapular right chest pain.  He reports that is pleuritic in nature.  He appears uncomfortable and reports it is worse with lying flat. EKG had significant artifact, but no acute ST changes.  Low suspicion for ACS.  However PE is in the differential.  Also with pleuritic chest pain along with x-ray findings, COVID-19 must be considered.  Covid testing initiated as well as a D-dimer 7:32 AM Age-adjusted D-dimer appears to be negative.  Repeat troponin is pending.  Patient still reports pleuritic chest pain though it is somewhat positional Patient does have elevated white count with questionable effusion on the right.  Could be developing pneumonia.  Will start oral antibiotics.  7:33 AM Plan at signout to Dr. Rush Landmarkegeler is to ambulate patient, follow-up on final troponin results.  If he is approved acute can be discharged home for outpatient management  Daniel Castro was evaluated  in Emergency Department on 01/26/2019 for the symptoms described in the history of present illness. He was evaluated in the context of the global COVID-19 pandemic, which necessitated consideration that the patient might be at risk for infection with the SARS-CoV-2 virus that causes COVID-19. Institutional protocols and algorithms that pertain to the evaluation of patients at risk for COVID-19 are in a state of rapid change based on information released by regulatory bodies including the CDC and federal and state organizations. These policies and algorithms were followed during the patient's care in the ED.\ Final Clinical Impressions(s) / ED Diagnoses   Final diagnoses:  Pleurisy    ED Discharge Orders         Ordered    doxycycline (VIBRAMYCIN) 100 MG capsule  2 times daily     01/26/19 0731           Zadie RhineWickline, Yailin Biederman, MD 01/26/19 [phone]

## 2019-01-26 NOTE — ED Notes (Signed)
Patient verbalizes understanding of discharge instructions. Opportunity for questioning and answers were provided. Armband removed by staff, pt discharged from ED.  

## 2019-01-26 NOTE — ED Notes (Signed)
Patient ambulated up and down hallway with ease maintaining pulse oximetry over 97%. No SOB or CP per patient.

## 2019-01-26 NOTE — ED Provider Notes (Signed)
10:26 AM Care assumed from Dr. Christy Gentles.  At time of transfer care, patient is awaiting reassessment after pain medication and ambulatory pulse oximetry.  Ox saturations were normal with ambulation.  Patient had age-adjusted D-dimer that was negative.  Patient is not having exertional chest discomfort.  He reports he is having pleuritic discomfort and was found to have possible pneumonia.  Patient was treated with antibiotics and had antibiotic prescription called in by previous team.  Due to the likely muscular type pain, will also add muscle relaxant.  Patient reports he has other pain medicine at home to use.  Patient agrees with plan of care and discharge.  Patient had outpatient Covid test sent and will follow up on that result.  He will stay isolated until then.  He has no other questions or concerns and was discharged in good condition with reassuring vital signs.   Clinical Impression: 1. Pleurisy   2. Community acquired pneumonia, unspecified laterality   3. Chest pain, unspecified type     Disposition: Discharge  Condition: Good  I have discussed the results, Dx and Tx plan with the pt(& family if present). He/she/they expressed understanding and agree(s) with the plan. Discharge instructions discussed at great length. Strict return precautions discussed and pt &/or family have verbalized understanding of the instructions. No further questions at time of discharge.    New Prescriptions   CYCLOBENZAPRINE (FLEXERIL) 10 MG TABLET    Take 1 tablet (10 mg total) by mouth 2 (two) times daily as needed for muscle spasms.   DOXYCYCLINE (VIBRAMYCIN) 100 MG CAPSULE    Take 1 capsule (100 mg total) by mouth 2 (two) times daily. One po bid x 7 days    Follow Up: Aura Dials, MD [place] [address] [place] EMERGENCY DEPARTMENT [address] [id number] mc [place]        Fabiha Rougeau, Gwenyth Allegra, MD 01/26/19 1028

## 2019-01-26 NOTE — ED Triage Notes (Signed)
Pt reports he began having R shoulder pain yesterday while sitting in his truck doing some work on the computer, last night he was unable to sleep on his R side due to the pain. Finally went to sleep and woke up with pain to R chest. Reports pain with deep breathing, denies n/v/d.

## 2021-03-31 IMAGING — DX DG ABDOMEN 1V
2 series · 2 of 2 positions shown · non-contrast
Comparison: Abdominal x-ray dated December 03, 2018. CT abdomen
pelvis dated October 22, 2018.

CLINICAL DATA: Left-sided kidney stones.

EXAM:
ABDOMEN - 1 VIEW

[abdomen kub (1 of 2)]
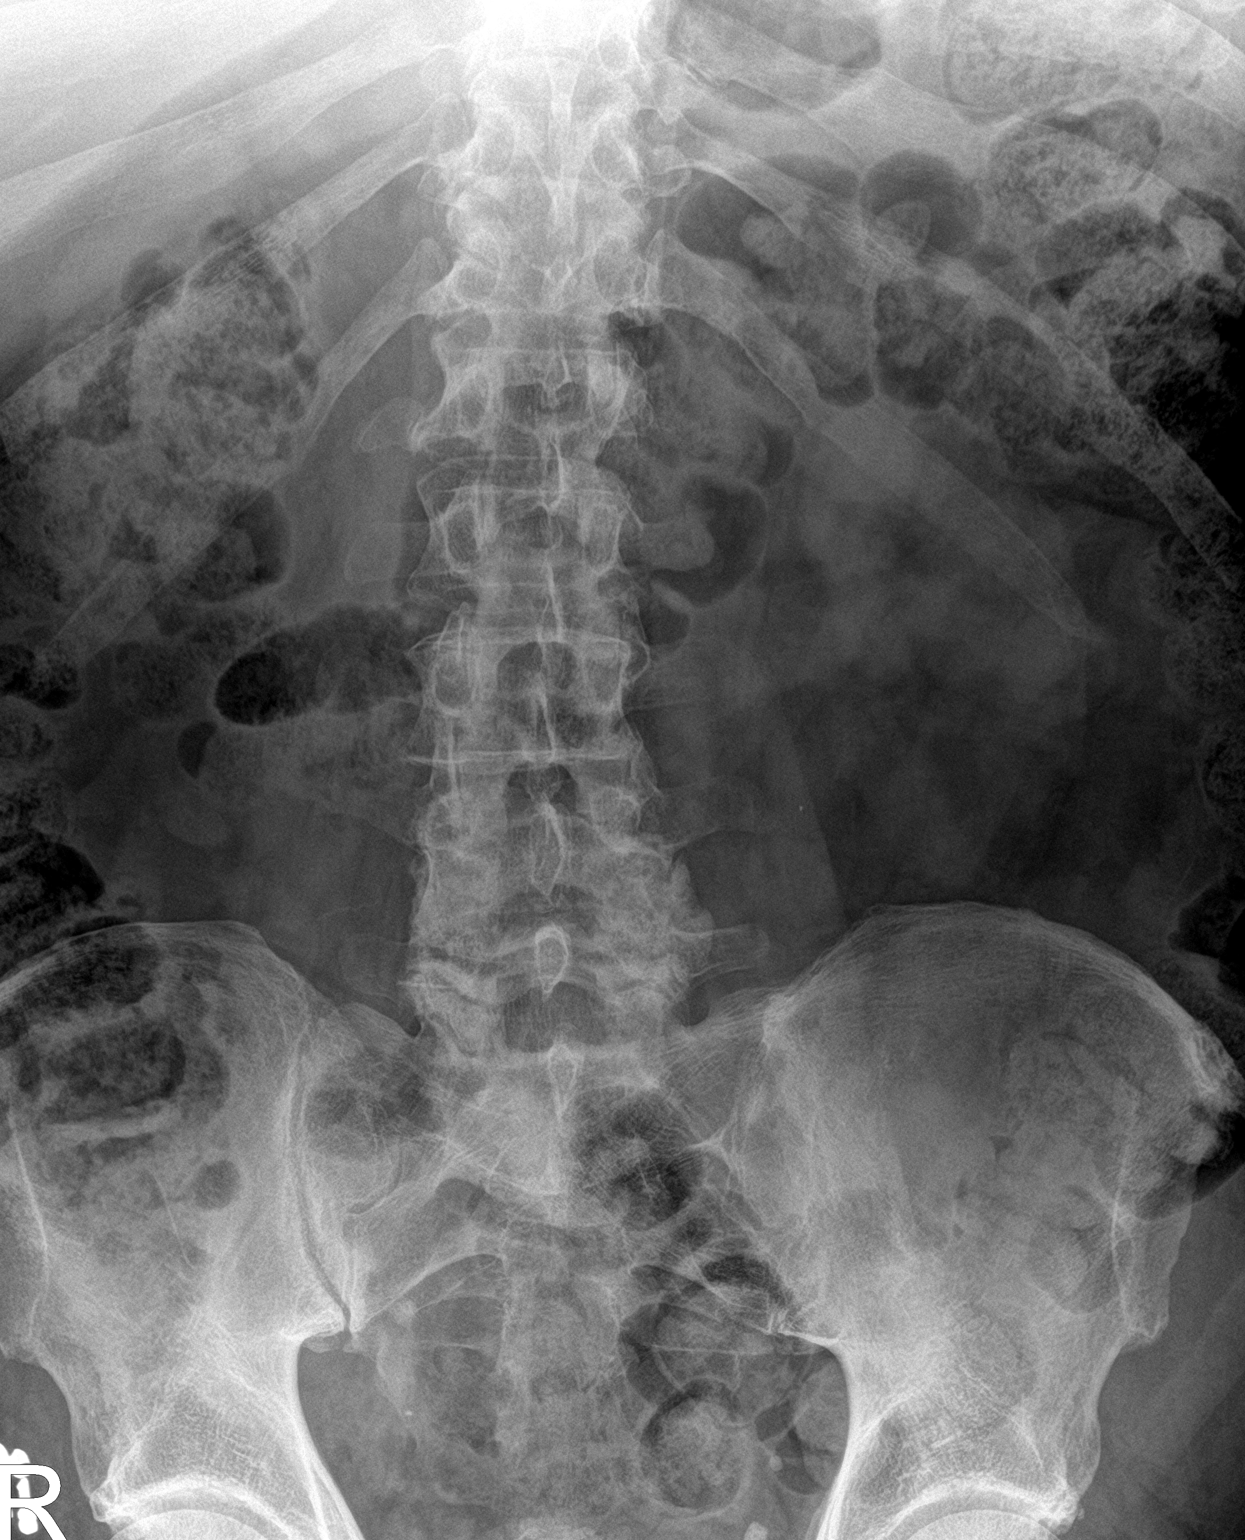

[abdomen kub (2 of 2)]
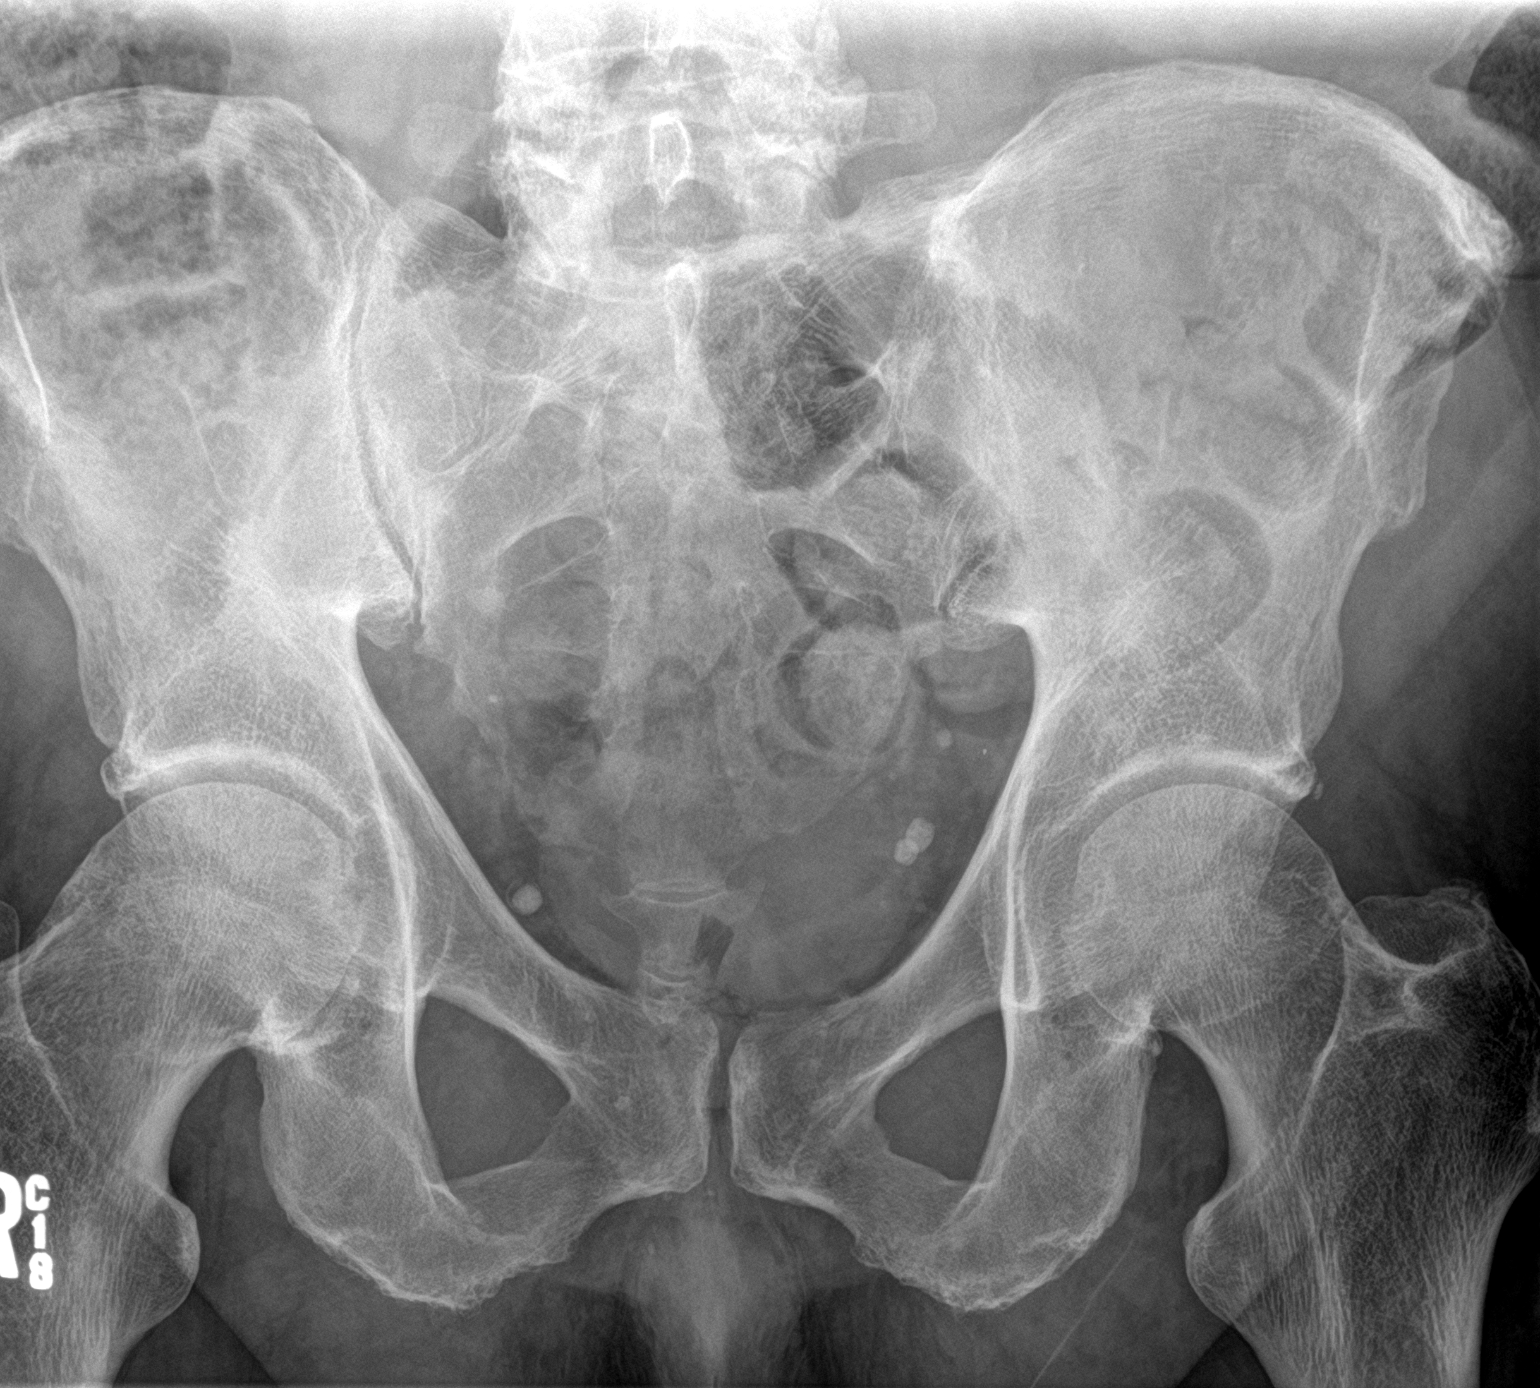

[2 of 2 positions shown; findings below may reference images not displayed]

FINDINGS: The 9 mm calculus in the bladder seen on x-rays from 12/03/2018 is
no longer identified. No additional radio-opaque calculi or other
significant radiographic abnormality are seen. Unchanged phleboliths
in the pelvis. Normal bowel gas pattern. Moderate amount of stool
throughout the colon. No acute osseous abnormality.
IMPRESSION: 1. Previously seen 9 mm bladder calculus is no longer identified.

## 2021-05-13 IMAGING — DX DG CHEST 1V PORT
1 series · 1 of 1 positions shown · non-contrast
Comparison: None.

CLINICAL DATA: Initial evaluation for acute chest pain.

EXAM:
PORTABLE CHEST 1 VIEW

[chest ap]
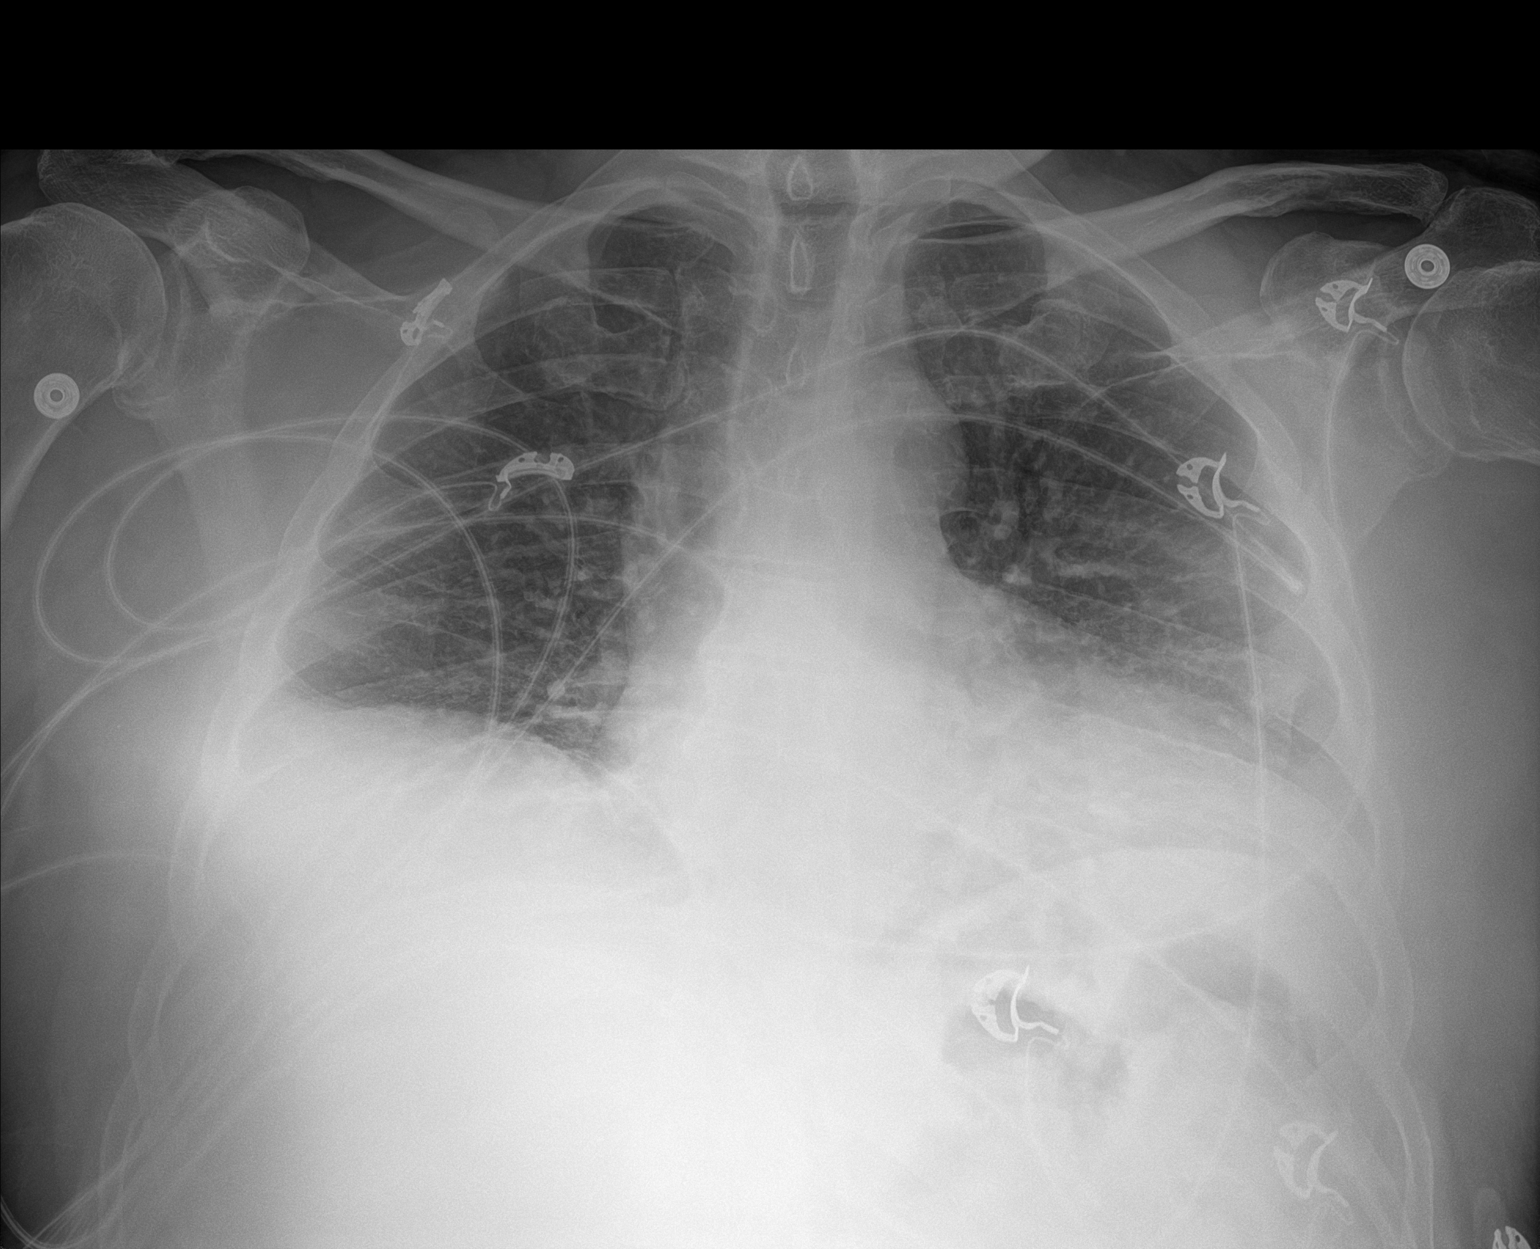

[1 of 1 positions shown; findings below may reference images not displayed]

FINDINGS: Cardiomegaly. Mediastinal silhouette within normal limits.

Lungs hypoinflated. Associated mild bibasilar atelectasis. Mild
diffuse pulmonary interstitial congestion without frank pulmonary
edema. Probable small right pleural effusion. No consolidative
opacity. No pneumothorax.

No acute osseous finding.
IMPRESSION: 1. Shallow lung inflation with associated mild bibasilar
atelectasis.
2. Mild diffuse pulmonary interstitial congestion without frank
pulmonary edema.
3. Probable small right pleural effusion.

## 2022-07-21 DIAGNOSIS — C4441 Basal cell carcinoma of skin of scalp and neck: Secondary | ICD-10-CM | POA: Diagnosis not present

## 2022-07-21 DIAGNOSIS — D225 Melanocytic nevi of trunk: Secondary | ICD-10-CM | POA: Diagnosis not present

## 2022-07-21 DIAGNOSIS — L821 Other seborrheic keratosis: Secondary | ICD-10-CM | POA: Diagnosis not present

## 2022-08-09 DIAGNOSIS — Z79899 Other long term (current) drug therapy: Secondary | ICD-10-CM | POA: Diagnosis not present

## 2022-08-09 DIAGNOSIS — E782 Mixed hyperlipidemia: Secondary | ICD-10-CM | POA: Diagnosis not present

## 2022-09-29 DIAGNOSIS — Z85828 Personal history of other malignant neoplasm of skin: Secondary | ICD-10-CM | POA: Diagnosis not present

## 2022-09-29 DIAGNOSIS — X32XXXD Exposure to sunlight, subsequent encounter: Secondary | ICD-10-CM | POA: Diagnosis not present

## 2022-09-29 DIAGNOSIS — L570 Actinic keratosis: Secondary | ICD-10-CM | POA: Diagnosis not present

## 2022-09-29 DIAGNOSIS — Z08 Encounter for follow-up examination after completed treatment for malignant neoplasm: Secondary | ICD-10-CM | POA: Diagnosis not present

## 2022-11-03 DIAGNOSIS — X32XXXD Exposure to sunlight, subsequent encounter: Secondary | ICD-10-CM | POA: Diagnosis not present

## 2022-11-03 DIAGNOSIS — L570 Actinic keratosis: Secondary | ICD-10-CM | POA: Diagnosis not present

## 2022-11-03 DIAGNOSIS — D0462 Carcinoma in situ of skin of left upper limb, including shoulder: Secondary | ICD-10-CM | POA: Diagnosis not present

## 2023-01-26 DIAGNOSIS — Z85828 Personal history of other malignant neoplasm of skin: Secondary | ICD-10-CM | POA: Diagnosis not present

## 2023-01-26 DIAGNOSIS — X32XXXD Exposure to sunlight, subsequent encounter: Secondary | ICD-10-CM | POA: Diagnosis not present

## 2023-01-26 DIAGNOSIS — Z08 Encounter for follow-up examination after completed treatment for malignant neoplasm: Secondary | ICD-10-CM | POA: Diagnosis not present

## 2023-01-26 DIAGNOSIS — L570 Actinic keratosis: Secondary | ICD-10-CM | POA: Diagnosis not present

## 2023-01-26 DIAGNOSIS — D225 Melanocytic nevi of trunk: Secondary | ICD-10-CM | POA: Diagnosis not present

## 2023-05-09 DIAGNOSIS — H524 Presbyopia: Secondary | ICD-10-CM | POA: Diagnosis not present

## 2023-05-30 DIAGNOSIS — R739 Hyperglycemia, unspecified: Secondary | ICD-10-CM | POA: Diagnosis not present

## 2023-05-30 DIAGNOSIS — Z1211 Encounter for screening for malignant neoplasm of colon: Secondary | ICD-10-CM | POA: Diagnosis not present

## 2023-05-30 DIAGNOSIS — N529 Male erectile dysfunction, unspecified: Secondary | ICD-10-CM | POA: Diagnosis not present

## 2023-05-30 DIAGNOSIS — I639 Cerebral infarction, unspecified: Secondary | ICD-10-CM | POA: Diagnosis not present

## 2023-05-30 DIAGNOSIS — I10 Essential (primary) hypertension: Secondary | ICD-10-CM | POA: Diagnosis not present

## 2023-05-30 DIAGNOSIS — Z0000 Encounter for general adult medical examination without abnormal findings: Secondary | ICD-10-CM | POA: Diagnosis not present

## 2023-05-30 DIAGNOSIS — Z6835 Body mass index (BMI) 35.0-35.9, adult: Secondary | ICD-10-CM | POA: Diagnosis not present

## 2023-05-30 DIAGNOSIS — N400 Enlarged prostate without lower urinary tract symptoms: Secondary | ICD-10-CM | POA: Diagnosis not present

## 2023-05-30 DIAGNOSIS — E782 Mixed hyperlipidemia: Secondary | ICD-10-CM | POA: Diagnosis not present

## 2023-06-30 DIAGNOSIS — Z1211 Encounter for screening for malignant neoplasm of colon: Secondary | ICD-10-CM | POA: Diagnosis not present

## 2023-10-23 DIAGNOSIS — M9902 Segmental and somatic dysfunction of thoracic region: Secondary | ICD-10-CM | POA: Diagnosis not present

## 2023-10-23 DIAGNOSIS — M9904 Segmental and somatic dysfunction of sacral region: Secondary | ICD-10-CM | POA: Diagnosis not present

## 2023-10-23 DIAGNOSIS — M9903 Segmental and somatic dysfunction of lumbar region: Secondary | ICD-10-CM | POA: Diagnosis not present

## 2023-10-23 DIAGNOSIS — M47817 Spondylosis without myelopathy or radiculopathy, lumbosacral region: Secondary | ICD-10-CM | POA: Diagnosis not present

## 2023-10-23 DIAGNOSIS — M9905 Segmental and somatic dysfunction of pelvic region: Secondary | ICD-10-CM | POA: Diagnosis not present

## 2023-10-23 DIAGNOSIS — M62830 Muscle spasm of back: Secondary | ICD-10-CM | POA: Diagnosis not present

## 2023-10-25 DIAGNOSIS — M9902 Segmental and somatic dysfunction of thoracic region: Secondary | ICD-10-CM | POA: Diagnosis not present

## 2023-10-25 DIAGNOSIS — M9903 Segmental and somatic dysfunction of lumbar region: Secondary | ICD-10-CM | POA: Diagnosis not present

## 2023-10-25 DIAGNOSIS — M62830 Muscle spasm of back: Secondary | ICD-10-CM | POA: Diagnosis not present

## 2023-10-25 DIAGNOSIS — M47817 Spondylosis without myelopathy or radiculopathy, lumbosacral region: Secondary | ICD-10-CM | POA: Diagnosis not present

## 2023-10-25 DIAGNOSIS — M9904 Segmental and somatic dysfunction of sacral region: Secondary | ICD-10-CM | POA: Diagnosis not present

## 2023-10-25 DIAGNOSIS — M9905 Segmental and somatic dysfunction of pelvic region: Secondary | ICD-10-CM | POA: Diagnosis not present
